# Patient Record
Sex: Female | Born: 1935 | Race: White | Hispanic: No | Smoking: Never smoker
Health system: Southern US, Community
[De-identification: ages and names within clinical notes are randomized; demographics above are authoritative.]

## PROBLEM LIST (undated history)

## (undated) DIAGNOSIS — L0390 Cellulitis, unspecified: Secondary | ICD-10-CM

## (undated) DIAGNOSIS — E7800 Pure hypercholesterolemia, unspecified: Secondary | ICD-10-CM

## (undated) DIAGNOSIS — I10 Essential (primary) hypertension: Secondary | ICD-10-CM

## (undated) DIAGNOSIS — K279 Peptic ulcer, site unspecified, unspecified as acute or chronic, without hemorrhage or perforation: Secondary | ICD-10-CM

## (undated) DIAGNOSIS — G459 Transient cerebral ischemic attack, unspecified: Secondary | ICD-10-CM

## (undated) DIAGNOSIS — N390 Urinary tract infection, site not specified: Secondary | ICD-10-CM

## (undated) DIAGNOSIS — R490 Dysphonia: Secondary | ICD-10-CM

## (undated) DIAGNOSIS — F0390 Unspecified dementia without behavioral disturbance: Secondary | ICD-10-CM

## (undated) DIAGNOSIS — L03113 Cellulitis of right upper limb: Secondary | ICD-10-CM

## (undated) DIAGNOSIS — R296 Repeated falls: Secondary | ICD-10-CM

## (undated) DIAGNOSIS — M539 Dorsopathy, unspecified: Secondary | ICD-10-CM

## (undated) HISTORY — DX: Cellulitis of right upper limb: L03.113

## (undated) HISTORY — PX: CHOLECYSTECTOMY: SHX55

## (undated) HISTORY — DX: Unspecified dementia, unspecified severity, without behavioral disturbance, psychotic disturbance, mood disturbance, and anxiety: F03.90

## (undated) HISTORY — DX: Dysphonia: R49.0

## (undated) HISTORY — DX: Pure hypercholesterolemia, unspecified: E78.00

## (undated) HISTORY — DX: Transient cerebral ischemic attack, unspecified: G45.9

## (undated) HISTORY — DX: Peptic ulcer, site unspecified, unspecified as acute or chronic, without hemorrhage or perforation: K27.9

## (undated) HISTORY — DX: Essential (primary) hypertension: I10

---

## 1999-12-08 ENCOUNTER — Ambulatory Visit (HOSPITAL_COMMUNITY): Admission: RE | Admit: 1999-12-08 | Discharge: 1999-12-08 | Payer: Self-pay | Admitting: Orthopedic Surgery

## 1999-12-08 ENCOUNTER — Encounter: Payer: Self-pay | Admitting: Orthopedic Surgery

## 2000-05-02 ENCOUNTER — Encounter: Payer: Self-pay | Admitting: Family Medicine

## 2000-05-02 ENCOUNTER — Encounter: Admission: RE | Admit: 2000-05-02 | Discharge: 2000-05-02 | Payer: Self-pay | Admitting: Family Medicine

## 2000-07-08 ENCOUNTER — Encounter: Payer: Self-pay | Admitting: Emergency Medicine

## 2000-07-08 ENCOUNTER — Emergency Department (HOSPITAL_COMMUNITY): Admission: EM | Admit: 2000-07-08 | Discharge: 2000-07-08 | Payer: Self-pay | Admitting: Emergency Medicine

## 2000-12-13 ENCOUNTER — Emergency Department (HOSPITAL_COMMUNITY): Admission: EM | Admit: 2000-12-13 | Discharge: 2000-12-14 | Payer: Self-pay | Admitting: Emergency Medicine

## 2000-12-13 ENCOUNTER — Encounter: Payer: Self-pay | Admitting: Emergency Medicine

## 2000-12-14 ENCOUNTER — Ambulatory Visit (HOSPITAL_COMMUNITY): Admission: EM | Admit: 2000-12-14 | Discharge: 2000-12-14 | Payer: Self-pay | Admitting: Emergency Medicine

## 2000-12-14 ENCOUNTER — Encounter: Payer: Self-pay | Admitting: Emergency Medicine

## 2001-05-18 ENCOUNTER — Encounter: Admission: RE | Admit: 2001-05-18 | Discharge: 2001-05-18 | Payer: Self-pay | Admitting: Family Medicine

## 2001-05-18 ENCOUNTER — Encounter: Payer: Self-pay | Admitting: Family Medicine

## 2002-05-14 ENCOUNTER — Encounter: Payer: Self-pay | Admitting: Family Medicine

## 2002-05-14 ENCOUNTER — Encounter: Admission: RE | Admit: 2002-05-14 | Discharge: 2002-05-14 | Payer: Self-pay | Admitting: Family Medicine

## 2003-05-19 ENCOUNTER — Encounter: Payer: Self-pay | Admitting: Family Medicine

## 2003-05-19 ENCOUNTER — Encounter: Admission: RE | Admit: 2003-05-19 | Discharge: 2003-05-19 | Payer: Self-pay | Admitting: Family Medicine

## 2004-05-25 ENCOUNTER — Encounter: Admission: RE | Admit: 2004-05-25 | Discharge: 2004-05-25 | Payer: Self-pay | Admitting: Family Medicine

## 2004-08-11 ENCOUNTER — Ambulatory Visit: Payer: Self-pay | Admitting: Family Medicine

## 2005-01-05 ENCOUNTER — Ambulatory Visit: Payer: Self-pay | Admitting: Family Medicine

## 2005-01-05 ENCOUNTER — Ambulatory Visit: Payer: Self-pay | Admitting: Internal Medicine

## 2005-01-20 ENCOUNTER — Ambulatory Visit: Payer: Self-pay | Admitting: Internal Medicine

## 2005-03-28 ENCOUNTER — Ambulatory Visit: Payer: Self-pay | Admitting: Family Medicine

## 2005-05-12 ENCOUNTER — Ambulatory Visit: Payer: Self-pay | Admitting: Family Medicine

## 2005-05-31 ENCOUNTER — Ambulatory Visit: Payer: Self-pay | Admitting: Family Medicine

## 2005-06-02 ENCOUNTER — Ambulatory Visit: Payer: Self-pay | Admitting: Family Medicine

## 2005-06-06 ENCOUNTER — Encounter: Admission: RE | Admit: 2005-06-06 | Discharge: 2005-06-06 | Payer: Self-pay | Admitting: Family Medicine

## 2005-06-24 ENCOUNTER — Encounter: Admission: RE | Admit: 2005-06-24 | Discharge: 2005-06-24 | Payer: Self-pay | Admitting: Neurology

## 2005-06-27 ENCOUNTER — Ambulatory Visit: Payer: Self-pay | Admitting: Family Medicine

## 2005-07-08 ENCOUNTER — Ambulatory Visit: Payer: Self-pay

## 2005-08-03 ENCOUNTER — Ambulatory Visit: Payer: Self-pay | Admitting: Family Medicine

## 2005-11-01 ENCOUNTER — Encounter: Admission: RE | Admit: 2005-11-01 | Discharge: 2005-11-01 | Payer: Self-pay | Admitting: Neurology

## 2005-11-02 ENCOUNTER — Ambulatory Visit: Payer: Self-pay | Admitting: Cardiology

## 2005-11-03 ENCOUNTER — Encounter: Payer: Self-pay | Admitting: Cardiology

## 2005-11-03 ENCOUNTER — Ambulatory Visit (HOSPITAL_COMMUNITY): Admission: RE | Admit: 2005-11-03 | Discharge: 2005-11-03 | Payer: Self-pay | Admitting: Cardiology

## 2005-11-03 ENCOUNTER — Ambulatory Visit: Payer: Self-pay | Admitting: Cardiology

## 2005-12-20 ENCOUNTER — Ambulatory Visit: Payer: Self-pay | Admitting: Family Medicine

## 2006-01-02 ENCOUNTER — Ambulatory Visit: Payer: Self-pay | Admitting: Family Medicine

## 2006-01-14 ENCOUNTER — Emergency Department (HOSPITAL_COMMUNITY): Admission: EM | Admit: 2006-01-14 | Discharge: 2006-01-14 | Payer: Self-pay | Admitting: Emergency Medicine

## 2006-01-17 ENCOUNTER — Ambulatory Visit: Payer: Self-pay | Admitting: Family Medicine

## 2006-06-07 ENCOUNTER — Encounter: Admission: RE | Admit: 2006-06-07 | Discharge: 2006-06-07 | Payer: Self-pay | Admitting: Family Medicine

## 2006-06-28 ENCOUNTER — Ambulatory Visit: Payer: Self-pay | Admitting: Family Medicine

## 2007-01-24 ENCOUNTER — Ambulatory Visit: Payer: Self-pay | Admitting: Family Medicine

## 2007-01-24 LAB — CONVERTED CEMR LAB
AST: 35 units/L (ref 0–37)
Albumin: 4.1 g/dL (ref 3.5–5.2)
Alkaline Phosphatase: 78 units/L (ref 39–117)
BUN: 29 mg/dL — ABNORMAL HIGH (ref 6–23)
Basophils Absolute: 0 10*3/uL (ref 0.0–0.1)
Chloride: 102 meq/L (ref 96–112)
Cholesterol: 251 mg/dL (ref 0–200)
Creatinine, Ser: 1.1 mg/dL (ref 0.4–1.2)
Direct LDL: 159.6 mg/dL
Eosinophils Absolute: 0.1 10*3/uL (ref 0.0–0.6)
GFR calc non Af Amer: 51 mL/min
HCT: 42.4 % (ref 36.0–46.0)
HDL: 42.6 mg/dL (ref >39.0)
MCHC: 33.7 g/dL (ref 30.0–36.0)
MCV: 95 fL (ref 78.0–100.0)
Monocytes Relative: 9 % (ref 3.0–11.0)
Neutrophils Relative %: 65.2 % (ref 43.0–77.0)
Platelets: 223 10*3/uL (ref 150–400)
Potassium: 3.3 meq/L — ABNORMAL LOW (ref 3.5–5.1)
RBC: 4.46 M/uL (ref 3.87–5.11)
RDW: 12.6 % (ref 11.5–14.6)
Sodium: 141 meq/L (ref 135–145)
TSH: 2.83 microintl units/mL (ref 0.35–5.50)
Total CHOL/HDL Ratio: 5.9
Triglycerides: 288 mg/dL (ref 0–149)

## 2007-06-05 DIAGNOSIS — M79609 Pain in unspecified limb: Secondary | ICD-10-CM

## 2007-06-07 ENCOUNTER — Ambulatory Visit: Payer: Self-pay | Admitting: Family Medicine

## 2007-06-07 DIAGNOSIS — M1990 Unspecified osteoarthritis, unspecified site: Secondary | ICD-10-CM | POA: Insufficient documentation

## 2007-06-11 ENCOUNTER — Telehealth: Payer: Self-pay | Admitting: Family Medicine

## 2007-06-14 ENCOUNTER — Encounter: Admission: RE | Admit: 2007-06-14 | Discharge: 2007-06-14 | Payer: Self-pay | Admitting: Family Medicine

## 2007-07-18 ENCOUNTER — Ambulatory Visit: Payer: Self-pay | Admitting: Family Medicine

## 2007-11-06 ENCOUNTER — Ambulatory Visit: Payer: Self-pay | Admitting: Family Medicine

## 2007-11-06 LAB — CONVERTED CEMR LAB
Glucose, Urine, Semiquant: NEGATIVE
Ketones, urine, test strip: NEGATIVE
pH: 5

## 2008-01-03 ENCOUNTER — Telehealth: Payer: Self-pay | Admitting: Family Medicine

## 2008-01-22 ENCOUNTER — Encounter: Admission: RE | Admit: 2008-01-22 | Discharge: 2008-01-22 | Payer: Self-pay

## 2008-03-18 ENCOUNTER — Encounter: Payer: Self-pay | Admitting: *Deleted

## 2008-06-18 ENCOUNTER — Encounter: Admission: RE | Admit: 2008-06-18 | Discharge: 2008-06-18 | Payer: Self-pay | Admitting: Family Medicine

## 2009-02-12 ENCOUNTER — Encounter: Admission: RE | Admit: 2009-02-12 | Discharge: 2009-02-12 | Payer: Self-pay | Admitting: Family Medicine

## 2009-03-09 ENCOUNTER — Encounter: Admission: RE | Admit: 2009-03-09 | Discharge: 2009-03-09 | Payer: Self-pay | Admitting: Family Medicine

## 2009-05-19 ENCOUNTER — Ambulatory Visit (HOSPITAL_COMMUNITY): Admission: RE | Admit: 2009-05-19 | Discharge: 2009-05-20 | Payer: Self-pay | Admitting: General Surgery

## 2009-05-19 ENCOUNTER — Encounter (INDEPENDENT_AMBULATORY_CARE_PROVIDER_SITE_OTHER): Payer: Self-pay | Admitting: General Surgery

## 2009-06-22 ENCOUNTER — Encounter: Admission: RE | Admit: 2009-06-22 | Discharge: 2009-06-22 | Payer: Self-pay | Admitting: Family Medicine

## 2009-08-10 ENCOUNTER — Encounter: Admission: RE | Admit: 2009-08-10 | Discharge: 2009-08-10 | Payer: Self-pay | Admitting: Family Medicine

## 2010-08-09 ENCOUNTER — Encounter: Admission: RE | Admit: 2010-08-09 | Discharge: 2010-08-09 | Payer: Self-pay | Admitting: Family Medicine

## 2011-01-01 LAB — HEPATIC FUNCTION PANEL
ALT: 27 U/L (ref 0–35)
AST: 29 U/L (ref 0–37)
Albumin: 4.1 g/dL (ref 3.5–5.2)
Alkaline Phosphatase: 65 U/L (ref 39–117)
Total Bilirubin: 0.7 mg/dL (ref 0.3–1.2)

## 2011-01-01 LAB — DIFFERENTIAL
Basophils Relative: 1 % (ref 0–1)
Lymphocytes Relative: 26 % (ref 12–46)
Lymphs Abs: 1.8 10*3/uL (ref 0.7–4.0)
Monocytes Relative: 9 % (ref 3–12)
Neutro Abs: 4.2 10*3/uL (ref 1.7–7.7)
Neutrophils Relative %: 63 % (ref 43–77)

## 2011-01-01 LAB — CBC
RBC: 4.18 MIL/uL (ref 3.87–5.11)
WBC: 6.8 10*3/uL (ref 4.0–10.5)

## 2011-01-01 LAB — BASIC METABOLIC PANEL
Calcium: 10.1 mg/dL (ref 8.4–10.5)
Creatinine, Ser: 0.99 mg/dL (ref 0.4–1.2)
GFR calc Af Amer: >60 mL/min (ref >60)
GFR calc non Af Amer: 54 mL/min — ABNORMAL LOW (ref >60)
Sodium: 139 mEq/L (ref 135–145)

## 2011-02-08 NOTE — Op Note (Signed)
Wanda Logan, Wanda Logan                ACCOUNT NO.:  [id number]   MEDICAL RECORD NO.:  [id number]          PATIENT TYPE:  OIB   LOCATION:  5123                         FACILITY:  [facility]   PHYSICIAN:  Ollen Gross. Vernell Morgans, M.D. DATE OF BIRTH:  [birth year]   DATE OF PROCEDURE:  05/19/2009  DATE OF DISCHARGE:                               OPERATIVE REPORT   PREOPERATIVE DIAGNOSIS:  Gallstones.   POSTOPERATIVE DIAGNOSES:  Cholecystitis, cholelithiasis.   PROCEDURE:  Laparoscopic cholecystectomy with intraoperative  cholangiogram.   SURGEON:  Ollen Gross. Vernell Morgans, MD   ASSISTANT:  Anselm Pancoast. Zachery Dakins, MD   ANESTHESIA:  General endotracheal.   PROCEDURE:  After informed consent was obtained, the patient was brought  to the operating room, placed in a supine position on operating room  table.  After adequate induction of general anesthesia, the patient's  abdomen was prepped with Betadine and draped in usual sterile manner.  The area below the umbilicus was infiltrated with 0.25% Marcaine.  A  small incision was made with 15-blade knife.  This incision was carried  down through the subcutaneous tissue bluntly with a hemostat and [employer] retractors until the linea alba was identified.  Linea alba was  incised with a 15-blade knife and each side was grasped with Kocher  clamps and elevated anteriorly.  The preperitoneal space was then probed  bluntly with a hemostat until the peritoneum was opened and access was  gained to the abdominal cavity.  A 0-Vicryl pursestring stitch was  placed in the fascia around the opening, Hasson cannula was placed  through the opening and anchored in place to the previously placed  Vicryl pursestring stitch.  The abdomen was then insufflated with carbon  dioxide without difficulty.  Laparoscope was inserted through the Hasson  cannula and the right upper quadrant was inspected, dome of gallbladder  and liver readily identified.  Next, the epigastric region was  infiltrated with 0.25% Marcaine.  A small incision was made with 15  blade knife and a 10-mm port was placed bluntly through this incision  into the abdominal cavity under direct vision.  Sites were then chosen  laterally on the right side with placement of 5-mm ports.  Each of these  areas were infiltrated with 0.25% Marcaine.  Small stab incisions were  made with a 15-blade knife and 5 mm ports placed bluntly through these  incisions into the abdominal cavity under direct vision.  The  gallbladder was tremendously thickened and difficult to grasp.  We tried  aspirating it, but it was just pack full of stones and aspirating really  did not help [place] grasp it very much.  We were able to elevate the dome of  the gallbladder anteriorly and superiorly.  Another blunt grasper was  placed through the other 5-mm port and used to retract on the body and  neck of the gallbladder.  Dissector was placed through the epigastric  port.  The peritoneal reflection at the gallbladder neck was opened.  Blunt dissection was carried out in this area.  This area was very  thickened and  inflamed and difficult to work with.  We were able to use  some hydrodissection and we were eventually able to identify the  gallbladder cystic duct junction and surround it bluntly and  circumferentially.  It was too thick to get a clip on, we therefore had  to make a small ductotomy at the gallbladder neck area.  We were able to  milk some stones out and evacuate them.  We then placed a 14-gauge  Angiocath through the anterior abdominal wall under direct vision.  A  Reddick cholangiogram catheter was placed through the Angiocath and  flushed.  The Reddick catheter was then placed within the cystic duct.  The balloon was inflated and we had a good seal.  We were able to get a  cholangiogram at that point, which showed no filling defects, a short  cystic duct, and good emptying in the duodenum.  We then removed the  Reddick  catheter.  We divided the cystic duct at that point and then  placed two #1 PDS Endoloops around the cystic duct stump.  This appeared  to give [place] good control of the cystic duct stump.  We were also able to  behind this identify the cystic artery and dissect it circumferentially  in a blunt manner until a good window was created.  Two clips were  placed proximally and distally and the artery was divided between the  two.  Next, we used a laparoscopic hook cautery device to separate the  gallbladder from liver bed prior to completely detaching gallbladder  from liver bed.  The liver bed was inspected and several small bleeding  points were coagulated with the electrocautery until the area was  completely hemostatic.  The gallbladder was then detached the rest of  the away from liver bed without difficulty with a laparoscopic hook.  Laparoscopic bag was inserted through the epigastric port.  The  gallbladder was placed in the bag with the bag was sealed.  The abdomen  was then irrigated with copious amounts of saline.  Laparoscope was  moved through the epigastric port.  Using a gallbladder grasper, we were  able to grasp the opening of the bag.  The bag with the gallbladder was  removed.  With the Hasson cannula through the infraumbilical port, we  did had widened fascial defect a little bit as well as evacuated some of  the stones before we could get the gallbladder out, but this happened  without any difficulty.  We then replaced the Hasson cannula, removed  the camera back to the Hasson.  A blunt grasper was placed through the  lateral-most 5-mm port and up through the epigastric port.  A 19-French  round Harrison Mons drain was then brought through these ports into the abdomen  out the lateral 5-mm port site.  The drain was placed in the gallbladder  bed.  The drain was anchored to the skin with 3-0 nylon stitch.  The  liver bed was inspected again and appeared to be hemostatic.  The camera   was then moved back to the epigastric port.  Hasson cannula was removed,  the fascial defect was closed with multiple figure-of-eight 0-Vicryl  stitches.  The rest of ports were then removed under direct vision and  gas was allowed to escape.  The skin incisions were closed with  interrupted 4-0 Monocryl subcuticular stitches.  Dermabond and sterile  dressings were applied.  The patient tolerated the procedure well.  At  the end of the case,  all needle, sponge, and instrument counts were  correct.  The patient was then awakened and taken to recovery room in  stable condition.      Ollen Gross. Vernell Morgans, M.D.  Electronically Signed     PST/MEDQ  D:  05/19/2009  T:  05/20/2009  Job:  [id number]

## 2011-11-21 ENCOUNTER — Other Ambulatory Visit: Payer: Self-pay | Admitting: Family Medicine

## 2011-11-22 ENCOUNTER — Other Ambulatory Visit: Payer: Self-pay | Admitting: Family Medicine

## 2011-11-22 DIAGNOSIS — N644 Mastodynia: Secondary | ICD-10-CM

## 2011-12-01 ENCOUNTER — Ambulatory Visit
Admission: RE | Admit: 2011-12-01 | Discharge: 2011-12-01 | Disposition: A | Discharge status: Home / Self Care | Payer: Medicare Other | Source: Ambulatory Visit | Attending: Family Medicine | Admitting: Family Medicine

## 2011-12-01 DIAGNOSIS — N644 Mastodynia: Secondary | ICD-10-CM

## 2012-03-08 ENCOUNTER — Encounter: Payer: Self-pay | Admitting: *Deleted

## 2012-11-05 ENCOUNTER — Other Ambulatory Visit: Payer: Self-pay | Admitting: Family Medicine

## 2012-11-05 DIAGNOSIS — Z1231 Encounter for screening mammogram for malignant neoplasm of breast: Secondary | ICD-10-CM

## 2012-11-17 ENCOUNTER — Emergency Department (HOSPITAL_BASED_OUTPATIENT_CLINIC_OR_DEPARTMENT_OTHER)
Admission: EM | Admit: 2012-11-17 | Discharge: 2012-11-17 | Disposition: A | Discharge status: Home / Self Care | Payer: Medicare Other | Attending: Emergency Medicine | Admitting: Emergency Medicine

## 2012-11-17 ENCOUNTER — Encounter (HOSPITAL_BASED_OUTPATIENT_CLINIC_OR_DEPARTMENT_OTHER): Payer: Self-pay | Admitting: Emergency Medicine

## 2012-11-17 ENCOUNTER — Emergency Department (HOSPITAL_BASED_OUTPATIENT_CLINIC_OR_DEPARTMENT_OTHER): Payer: Medicare Other

## 2012-11-17 DIAGNOSIS — Y939 Activity, unspecified: Secondary | ICD-10-CM | POA: Insufficient documentation

## 2012-11-17 DIAGNOSIS — M545 Low back pain: Secondary | ICD-10-CM

## 2012-11-17 DIAGNOSIS — Z87440 Personal history of urinary (tract) infections: Secondary | ICD-10-CM | POA: Insufficient documentation

## 2012-11-17 DIAGNOSIS — R296 Repeated falls: Secondary | ICD-10-CM | POA: Insufficient documentation

## 2012-11-17 DIAGNOSIS — W19XXXA Unspecified fall, initial encounter: Secondary | ICD-10-CM

## 2012-11-17 DIAGNOSIS — Y929 Unspecified place or not applicable: Secondary | ICD-10-CM | POA: Insufficient documentation

## 2012-11-17 DIAGNOSIS — Z8673 Personal history of transient ischemic attack (TIA), and cerebral infarction without residual deficits: Secondary | ICD-10-CM | POA: Insufficient documentation

## 2012-11-17 DIAGNOSIS — Z8711 Personal history of peptic ulcer disease: Secondary | ICD-10-CM | POA: Insufficient documentation

## 2012-11-17 DIAGNOSIS — IMO0002 Reserved for concepts with insufficient information to code with codable children: Secondary | ICD-10-CM | POA: Insufficient documentation

## 2012-11-17 DIAGNOSIS — I10 Essential (primary) hypertension: Secondary | ICD-10-CM | POA: Insufficient documentation

## 2012-11-17 DIAGNOSIS — Z79899 Other long term (current) drug therapy: Secondary | ICD-10-CM | POA: Insufficient documentation

## 2012-11-17 HISTORY — DX: Urinary tract infection, site not specified: N39.0

## 2012-11-17 HISTORY — DX: Dorsopathy, unspecified: M53.9

## 2012-11-17 LAB — URINALYSIS, ROUTINE W REFLEX MICROSCOPIC
Hgb urine dipstick: NEGATIVE
Protein, ur: NEGATIVE mg/dL
Urobilinogen, UA: 0.2 mg/dL (ref 0.0–1.0)

## 2012-11-17 MED ORDER — TRAMADOL HCL 50 MG PO TABS: 50.0000 mg | ORAL_TABLET | Freq: Four times a day (QID) | ORAL | Status: DC | PRN
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
## 2012-11-17 NOTE — ED Notes (Signed)
Pt states she fell approximately one week ago, but also fell again a few days ago.  Family states they noticed that the pt was c/o back pain, strong urine smell, and decreased appetite.

## 2012-12-04 ENCOUNTER — Ambulatory Visit: Payer: Medicare Other

## 2012-12-17 ENCOUNTER — Emergency Department (HOSPITAL_COMMUNITY): Payer: Medicare Other

## 2012-12-17 ENCOUNTER — Encounter (HOSPITAL_COMMUNITY): Payer: Self-pay | Admitting: Emergency Medicine

## 2012-12-17 DIAGNOSIS — F0390 Unspecified dementia without behavioral disturbance: Secondary | ICD-10-CM | POA: Insufficient documentation

## 2012-12-17 DIAGNOSIS — S2239XA Fracture of one rib, unspecified side, initial encounter for closed fracture: Secondary | ICD-10-CM | POA: Insufficient documentation

## 2012-12-17 DIAGNOSIS — W1809XA Striking against other object with subsequent fall, initial encounter: Secondary | ICD-10-CM | POA: Insufficient documentation

## 2012-12-17 DIAGNOSIS — T07XXXA Unspecified multiple injuries, initial encounter: Secondary | ICD-10-CM | POA: Insufficient documentation

## 2012-12-17 DIAGNOSIS — IMO0002 Reserved for concepts with insufficient information to code with codable children: Secondary | ICD-10-CM | POA: Insufficient documentation

## 2012-12-17 DIAGNOSIS — Z79899 Other long term (current) drug therapy: Secondary | ICD-10-CM | POA: Insufficient documentation

## 2012-12-17 DIAGNOSIS — Z87440 Personal history of urinary (tract) infections: Secondary | ICD-10-CM | POA: Insufficient documentation

## 2012-12-17 DIAGNOSIS — Z8673 Personal history of transient ischemic attack (TIA), and cerebral infarction without residual deficits: Secondary | ICD-10-CM | POA: Insufficient documentation

## 2012-12-17 DIAGNOSIS — I10 Essential (primary) hypertension: Secondary | ICD-10-CM | POA: Insufficient documentation

## 2012-12-17 DIAGNOSIS — Y92009 Unspecified place in unspecified non-institutional (private) residence as the place of occurrence of the external cause: Secondary | ICD-10-CM | POA: Insufficient documentation

## 2012-12-17 DIAGNOSIS — Y9389 Activity, other specified: Secondary | ICD-10-CM | POA: Insufficient documentation

## 2012-12-17 DIAGNOSIS — Z8711 Personal history of peptic ulcer disease: Secondary | ICD-10-CM | POA: Insufficient documentation

## 2012-12-17 LAB — CBC WITH DIFFERENTIAL/PLATELET
Basophils Absolute: 0 10*3/uL (ref 0.0–0.1)
Eosinophils Absolute: 0.1 10*3/uL (ref 0.0–0.7)
Eosinophils Relative: 2 % (ref 0–5)
Lymphs Abs: 0.9 10*3/uL (ref 0.7–4.0)
MCH: 32.5 pg (ref 26.0–34.0)
MCV: 92 fL (ref 78.0–100.0)
Monocytes Absolute: 0.7 10*3/uL (ref 0.1–1.0)
Platelets: 158 10*3/uL (ref 150–400)
RDW: 14.1 % (ref 11.5–15.5)

## 2012-12-17 LAB — COMPREHENSIVE METABOLIC PANEL
ALT: 14 U/L (ref 0–35)
Calcium: 10.2 mg/dL (ref 8.4–10.5)
Creatinine, Ser: 0.96 mg/dL (ref 0.50–1.10)
GFR calc Af Amer: 61 mL/min — ABNORMAL LOW (ref >90)
Glucose, Bld: 117 mg/dL — ABNORMAL HIGH (ref 70–99)
Sodium: 141 mEq/L (ref 135–145)
Total Protein: 6.9 g/dL (ref 6.0–8.3)

## 2012-12-17 NOTE — ED Notes (Addendum)
DAUGHTER REPORTS PT. FELL THIS MORNING AT HOME , PRESENTS WITH PAIN AT RIGHT LATERAL RIBCAGE , SMALL SKIN TEAR AT LEFT ELBOW AND LEFT UPPER LIP SWELLING , RESPIRATIONS UNLABORED , DAUGHTER ALSO CONCERNED ON PT'S PROGRESSING DEMENTIA. PT. CURRENTLY TAKING CIPRO ANTIBIOTIC FOR UTI.

## 2012-12-18 ENCOUNTER — Emergency Department (HOSPITAL_COMMUNITY)
Admission: EM | Admit: 2012-12-18 | Discharge: 2012-12-18 | Disposition: A | Discharge status: Home / Self Care | Payer: Medicare Other | Attending: Emergency Medicine | Admitting: Emergency Medicine

## 2012-12-18 ENCOUNTER — Emergency Department (HOSPITAL_COMMUNITY): Payer: Medicare Other

## 2012-12-18 DIAGNOSIS — T07XXXA Unspecified multiple injuries, initial encounter: Secondary | ICD-10-CM

## 2012-12-18 DIAGNOSIS — F0390 Unspecified dementia without behavioral disturbance: Secondary | ICD-10-CM

## 2012-12-18 DIAGNOSIS — S2231XA Fracture of one rib, right side, initial encounter for closed fracture: Secondary | ICD-10-CM

## 2012-12-18 NOTE — ED Provider Notes (Signed)
History     CSN: [id number]  Arrival date & time 12/17/12  2017   First MD Initiated Contact with Patient 12/18/12 0036      Chief Complaint  Patient presents with  . Fall    (Consider location/radiation/quality/duration/timing/severity/associated sxs/prior treatment) HPI Level 5 Caveat: dementia. This is an 79 year old female who has had a decline in mental function since about December. History is provided by her daughters. This is worsened over the past 3 weeks with confusion, difficulty expressing herself verbally, and wandering at night. She was wandering around yesterday morning at home and fell against a dresser. He has a contusion to her left upper lip. She has a contusion to her left breast. She has pain and tenderness to her right chest. She hasn't superficial is in tears of the left arm. She is awake and alert to her baseline mentally. She has not had any respiratory distress. She is being treated for urinary tract with Cipro; she has had multiple episodes of urinary tract infection in the past several months.  Past Medical History  Diagnosis Date  . Hypertension   . TIA (transient ischemic attack)   . Peptic ulcer disease   . UTI (lower urinary tract infection)   . Back problem     History reviewed. No pertinent past surgical history.  Family History  Problem Relation Age of Onset  . Stroke Father   . Stroke Mother   . Aneurysm Brother   . Coronary artery disease Sister   . Heart attack Sister   . Stroke Sister   . Stroke Sister     History  Substance Use Topics  . Smoking status: Never Smoker   . Smokeless tobacco: Never Used  . Alcohol Use: No    OB History   Grav Para Term Preterm Abortions TAB SAB Ect Mult Living                  Review of Systems  Unable to perform ROS   Allergies  Aspirin; Atorvastatin; and Penicillins  Home Medications   Current Outpatient Rx  Name  Route  Sig  Dispense  Refill  . acetaminophen (TYLENOL) 325 MG  tablet   Oral   Take 650 mg by mouth daily as needed for pain.         Marland Kitchen allopurinol (ZYLOPRIM) 100 MG tablet   Oral   Take 100 mg by mouth daily.         Marland Kitchen atenolol (TENORMIN) 50 MG tablet   Oral   Take 50 mg by mouth daily.         . Calcium Carbonate-Vitamin D (CALTRATE 600+D PO)   Oral   Take 1 tablet by mouth daily.         . celecoxib (CELEBREX) 200 MG capsule   Oral   Take 200 mg by mouth as needed.         . darifenacin (ENABLEX) 7.5 MG 24 hr tablet   Oral   Take 7.5 mg by mouth daily.         Marland Kitchen dipyridamole-aspirin (AGGRENOX) 25-200 MG per 12 hr capsule   Oral   Take 1 capsule by mouth 2 (two) times daily.         Marland Kitchen estradiol (ESTRACE) 0.1 MG/GM vaginal cream   Vaginal   Place 2 g vaginally 2 (two) times a week.         . fish oil-omega-3 fatty acids 1000 MG capsule   Oral  Take 1 g by mouth daily.         Marland Kitchen glucosamine-chondroitin 500-400 MG tablet   Oral   Take 1 tablet by mouth daily.         Marland Kitchen LORazepam (ATIVAN) 0.5 MG tablet   Oral   Take 0.5 mg by mouth every 8 (eight) hours as needed for anxiety.         . metroNIDAZOLE (METROCREAM) 0.75 % cream   Topical   Apply 1 application topically 2 (two) times daily.         . simvastatin (ZOCOR) 10 MG tablet   Oral   Take 10 mg by mouth at bedtime.         . traMADol (ULTRAM) 50 MG tablet   Oral   Take 50 mg by mouth every 6 (six) hours as needed for pain.           BP 125/57  Pulse 61  Temp(Src) 98.5 F (36.9 C) (Oral)  Resp 18  SpO2 94%  Physical Exam General: Well-developed, well-nourished female in no acute distress; appearance consistent with age of record HENT: normocephalic, swelling and ecchymosis of left upper lip Eyes: pupils equal round and reactive to light; extraocular muscles intact Neck: supple Heart: regular rate and rhythm Lungs: clear to auscultation bilaterally Chest: Ecchymosis and mild tenderness of the left breast; tenderness right lower  lateral rib cage without crepitus Abdomen: soft; nondistended; mild diffuse tenderness; bowel sounds present Extremities: No deformity; full range of motion; trace edema of lower legs; several superficial skin tears of left upper extremity Neurologic: Awake, alert, minimally verbal; motor function intact in all extremities and symmetric; no facial droop Skin: Warm and dry Psychiatric: Normal mood and affect    ED Course  Procedures (including critical care time)     MDM   Nursing notes and vitals signs, including pulse oximetry, reviewed.  Summary of this visit's results, reviewed by myself:  Labs:  Results for orders placed during the hospital encounter of 12/18/12 (from the past 24 hour(s))  CBC WITH DIFFERENTIAL     Status: None   Collection Time    12/17/12  8:37 PM      Result Value Range   WBC 7.5  4.0 - 10.5 K/uL   RBC 4.15  3.87 - 5.11 MIL/uL   Hemoglobin 13.5  12.0 - 15.0 g/dL   HCT 16.1  09.6 - 04.5 %   MCV 92.0  78.0 - 100.0 fL   MCH 32.5  26.0 - 34.0 pg   MCHC 35.3  30.0 - 36.0 g/dL   RDW 40.9  81.1 - 91.4 %   Platelets 158  150 - 400 K/uL   Neutrophils Relative 76  43 - 77 %   Neutro Abs 5.7  1.7 - 7.7 K/uL   Lymphocytes Relative 12  12 - 46 %   Lymphs Abs 0.9  0.7 - 4.0 K/uL   Monocytes Relative 10  3 - 12 %   Monocytes Absolute 0.7  0.1 - 1.0 K/uL   Eosinophils Relative 2  0 - 5 %   Eosinophils Absolute 0.1  0.0 - 0.7 K/uL   Basophils Relative 0  0 - 1 %   Basophils Absolute 0.0  0.0 - 0.1 K/uL  COMPREHENSIVE METABOLIC PANEL     Status: Abnormal   Collection Time    12/17/12  8:37 PM      Result Value Range   Sodium 141  135 - 145 mEq/L  Potassium 4.1  3.5 - 5.1 mEq/L   Chloride 103  96 - 112 mEq/L   CO2 27  19 - 32 mEq/L   Glucose, Bld 117 (*) 70 - 99 mg/dL   BUN 19  6 - 23 mg/dL   Creatinine, Ser 4.54  0.50 - 1.10 mg/dL   Calcium 09.8  8.4 - 11.9 mg/dL   Total Protein 6.9  6.0 - 8.3 g/dL   Albumin 4.0  3.5 - 5.2 g/dL   AST 19  0 - 37 U/L    ALT 14  0 - 35 U/L   Alkaline Phosphatase 85  39 - 117 U/L   Total Bilirubin 0.6  0.3 - 1.2 mg/dL   GFR calc non Af Amer 52 (*) >90 mL/min   GFR calc Af Amer 61 (*) >90 mL/min    Imaging Studies: Dg Ribs Unilateral W/chest Right  12/17/2012  *RADIOLOGY REPORT*  Clinical Data: Right rib pain secondary to a fall.  RIGHT RIBS AND CHEST - 3+ VIEW  Comparison: Chest x-ray dated 03/09/2009  Findings: There is a displaced fracture of the lateral aspect of the right tenth rib.  Tortuous thoracic aorta.  Pulmonary vascularity and overall heart size is normal.  Slight scarring at both lung bases.  No pneumothorax or lung contusion or pleural effusion.  Arthritic changes at both shoulders, right greater than left.  Moderate thoracolumbar scoliosis, unchanged.  IMPRESSION: Acute displaced fracture of the lateral aspect of the right tenth rib.   Original Report Authenticated By: Francene Boyers, M.D.    Ct Head Wo Contrast  12/18/2012  *RADIOLOGY REPORT*  Clinical Data: Larey Seat.  Hit head.  CT HEAD WITHOUT CONTRAST  Technique:  Contiguous axial images were obtained from the base of the skull through the vertex without contrast.  Comparison: MRI brain 11/01/2005.  Findings: There is age related cerebral atrophy, ventriculomegaly and periventricular white matter disease.  Remote cortical infarcts are suspected at the right vertex.  No CT findings for acute hemispheric infarction and/or intracranial hemorrhage.  No extra- axial fluid collections are identified.  The brainstem and cerebellum grossly normal.  No acute bony findings.  The paranasal sinuses mastoid air cells are clear.  Globes are intact.  IMPRESSION:  1.  No acute intracranial findings or acute skull fracture. 2.  Age related cerebral atrophy, ventriculomegaly and periventricular white matter disease. 3.  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   Original Report Authenticated By: Rudie Meyer, M.D.    EKG Interpretation:  Date & Time:  12/17/2012 8:46 PM  Rate: 70  Rhythm: normal sinus rhythm and premature ventricular contractions (PVC)  QRS Axis: normal  Intervals: normal  ST/T Wave abnormalities: normal  Conduction Disutrbances:none  Narrative Interpretation: Poor R-wave progression  Old EKG Reviewed: none available  2:30 AM Patient alert more talkative at this time. Patient's family advised of her lab and x-ray findings. She has Ultram at home she can take for treatment of her rib pain.          Hanley Seamen, MD 12/18/12 [phone]

## 2012-12-18 NOTE — ED Notes (Signed)
Pt or family denies any questions, pt denies any pain upon discharge.

## 2013-02-09 ENCOUNTER — Encounter: Payer: Self-pay | Admitting: Neurology

## 2013-02-09 DIAGNOSIS — N390 Urinary tract infection, site not specified: Secondary | ICD-10-CM | POA: Insufficient documentation

## 2013-02-09 DIAGNOSIS — M539 Dorsopathy, unspecified: Secondary | ICD-10-CM | POA: Insufficient documentation

## 2013-02-09 DIAGNOSIS — G459 Transient cerebral ischemic attack, unspecified: Secondary | ICD-10-CM

## 2013-02-09 DIAGNOSIS — R490 Dysphonia: Secondary | ICD-10-CM

## 2013-02-09 DIAGNOSIS — I10 Essential (primary) hypertension: Secondary | ICD-10-CM

## 2013-02-09 DIAGNOSIS — K279 Peptic ulcer, site unspecified, unspecified as acute or chronic, without hemorrhage or perforation: Secondary | ICD-10-CM | POA: Insufficient documentation

## 2013-02-09 DIAGNOSIS — F0390 Unspecified dementia without behavioral disturbance: Secondary | ICD-10-CM | POA: Insufficient documentation

## 2013-02-09 DIAGNOSIS — E7800 Pure hypercholesterolemia, unspecified: Secondary | ICD-10-CM

## 2013-02-11 ENCOUNTER — Ambulatory Visit: Payer: Medicare Other | Admitting: Neurology

## 2013-02-25 ENCOUNTER — Encounter (HOSPITAL_COMMUNITY): Payer: Self-pay | Admitting: *Deleted

## 2013-02-25 ENCOUNTER — Inpatient Hospital Stay (HOSPITAL_COMMUNITY)
Admission: EM | Admit: 2013-02-25 | Discharge: 2013-03-02 | DRG: 602 | Disposition: A | Discharge status: Skilled Nursing Facility | Payer: Medicare Other | Attending: Internal Medicine | Admitting: Internal Medicine

## 2013-02-25 ENCOUNTER — Emergency Department (HOSPITAL_COMMUNITY): Payer: Medicare Other

## 2013-02-25 DIAGNOSIS — N390 Urinary tract infection, site not specified: Secondary | ICD-10-CM

## 2013-02-25 DIAGNOSIS — Z9181 History of falling: Secondary | ICD-10-CM

## 2013-02-25 DIAGNOSIS — M79609 Pain in unspecified limb: Secondary | ICD-10-CM

## 2013-02-25 DIAGNOSIS — I10 Essential (primary) hypertension: Secondary | ICD-10-CM | POA: Diagnosis present

## 2013-02-25 DIAGNOSIS — M1990 Unspecified osteoarthritis, unspecified site: Secondary | ICD-10-CM

## 2013-02-25 DIAGNOSIS — Z880 Allergy status to penicillin: Secondary | ICD-10-CM

## 2013-02-25 DIAGNOSIS — D696 Thrombocytopenia, unspecified: Secondary | ICD-10-CM | POA: Diagnosis present

## 2013-02-25 DIAGNOSIS — R490 Dysphonia: Secondary | ICD-10-CM

## 2013-02-25 DIAGNOSIS — R296 Repeated falls: Secondary | ICD-10-CM

## 2013-02-25 DIAGNOSIS — Z7982 Long term (current) use of aspirin: Secondary | ICD-10-CM

## 2013-02-25 DIAGNOSIS — M539 Dorsopathy, unspecified: Secondary | ICD-10-CM

## 2013-02-25 DIAGNOSIS — Z8673 Personal history of transient ischemic attack (TIA), and cerebral infarction without residual deficits: Secondary | ICD-10-CM

## 2013-02-25 DIAGNOSIS — Z823 Family history of stroke: Secondary | ICD-10-CM

## 2013-02-25 DIAGNOSIS — G459 Transient cerebral ischemic attack, unspecified: Secondary | ICD-10-CM

## 2013-02-25 DIAGNOSIS — L03113 Cellulitis of right upper limb: Secondary | ICD-10-CM | POA: Diagnosis present

## 2013-02-25 DIAGNOSIS — Z888 Allergy status to other drugs, medicaments and biological substances status: Secondary | ICD-10-CM

## 2013-02-25 DIAGNOSIS — K279 Peptic ulcer, site unspecified, unspecified as acute or chronic, without hemorrhage or perforation: Secondary | ICD-10-CM

## 2013-02-25 DIAGNOSIS — IMO0001 Reserved for inherently not codable concepts without codable children: Secondary | ICD-10-CM

## 2013-02-25 DIAGNOSIS — IMO0002 Reserved for concepts with insufficient information to code with codable children: Principal | ICD-10-CM | POA: Diagnosis present

## 2013-02-25 DIAGNOSIS — M7020 Olecranon bursitis, unspecified elbow: Secondary | ICD-10-CM | POA: Diagnosis present

## 2013-02-25 DIAGNOSIS — Z9089 Acquired absence of other organs: Secondary | ICD-10-CM

## 2013-02-25 DIAGNOSIS — Z79899 Other long term (current) drug therapy: Secondary | ICD-10-CM

## 2013-02-25 DIAGNOSIS — L03119 Cellulitis of unspecified part of limb: Secondary | ICD-10-CM

## 2013-02-25 DIAGNOSIS — E860 Dehydration: Secondary | ICD-10-CM

## 2013-02-25 DIAGNOSIS — F0390 Unspecified dementia without behavioral disturbance: Secondary | ICD-10-CM

## 2013-02-25 DIAGNOSIS — M109 Gout, unspecified: Secondary | ICD-10-CM | POA: Diagnosis present

## 2013-02-25 DIAGNOSIS — Z7902 Long term (current) use of antithrombotics/antiplatelets: Secondary | ICD-10-CM

## 2013-02-25 DIAGNOSIS — Z886 Allergy status to analgesic agent status: Secondary | ICD-10-CM

## 2013-02-25 DIAGNOSIS — E43 Unspecified severe protein-calorie malnutrition: Secondary | ICD-10-CM | POA: Diagnosis present

## 2013-02-25 DIAGNOSIS — E785 Hyperlipidemia, unspecified: Secondary | ICD-10-CM | POA: Diagnosis present

## 2013-02-25 DIAGNOSIS — Z8249 Family history of ischemic heart disease and other diseases of the circulatory system: Secondary | ICD-10-CM

## 2013-02-25 DIAGNOSIS — Z602 Problems related to living alone: Secondary | ICD-10-CM

## 2013-02-25 DIAGNOSIS — E7800 Pure hypercholesterolemia, unspecified: Secondary | ICD-10-CM | POA: Diagnosis present

## 2013-02-25 DIAGNOSIS — L0390 Cellulitis, unspecified: Secondary | ICD-10-CM

## 2013-02-25 HISTORY — DX: Cellulitis of right upper limb: L03.113

## 2013-02-25 HISTORY — DX: Cellulitis, unspecified: L03.90

## 2013-02-25 HISTORY — DX: Repeated falls: R29.6

## 2013-02-25 LAB — CBC WITH DIFFERENTIAL/PLATELET
Basophils Relative: 0 % (ref 0–1)
HCT: 35.9 % — ABNORMAL LOW (ref 36.0–46.0)
Hemoglobin: 12.7 g/dL (ref 12.0–15.0)
Lymphocytes Relative: 3 % — ABNORMAL LOW (ref 12–46)
Lymphs Abs: 0.4 10*3/uL — ABNORMAL LOW (ref 0.7–4.0)
MCHC: 35.4 g/dL (ref 30.0–36.0)
Monocytes Absolute: 0.8 10*3/uL (ref 0.1–1.0)
Monocytes Relative: 6 % (ref 3–12)
Neutro Abs: 11.6 10*3/uL — ABNORMAL HIGH (ref 1.7–7.7)
RBC: 4 MIL/uL (ref 3.87–5.11)

## 2013-02-25 LAB — URINE MICROSCOPIC-ADD ON

## 2013-02-25 LAB — COMPREHENSIVE METABOLIC PANEL
Alkaline Phosphatase: 85 U/L (ref 39–117)
BUN: 27 mg/dL — ABNORMAL HIGH (ref 6–23)
CO2: 22 mEq/L (ref 19–32)
Chloride: 97 mEq/L (ref 96–112)
Creatinine, Ser: 0.9 mg/dL (ref 0.50–1.10)
GFR calc non Af Amer: 57 mL/min — ABNORMAL LOW (ref >90)
Glucose, Bld: 118 mg/dL — ABNORMAL HIGH (ref 70–99)
Potassium: 3.5 mEq/L (ref 3.5–5.1)
Total Bilirubin: 1.1 mg/dL (ref 0.3–1.2)

## 2013-02-25 LAB — URINALYSIS, ROUTINE W REFLEX MICROSCOPIC
Ketones, ur: 40 mg/dL — AB
Protein, ur: 100 mg/dL — AB
Urobilinogen, UA: 1 mg/dL (ref 0.0–1.0)

## 2013-02-25 MED ORDER — SODIUM CHLORIDE 0.9 % IV SOLN: INTRAVENOUS | Status: AC

## 2013-02-25 MED ORDER — SODIUM CHLORIDE 0.9 % IV SOLN: Freq: Once | INTRAVENOUS | Status: DC

## 2013-02-25 MED ORDER — CELECOXIB 200 MG PO CAPS
200.0000 mg | ORAL_CAPSULE | Freq: Every day | ORAL | Status: DC
  Administered 2013-02-25 – 2013-03-02 (×6): 200 mg via ORAL
  Filled 2013-02-25 (×6): qty 1

## 2013-02-25 MED ORDER — SODIUM CHLORIDE 0.9 % IV BOLUS (SEPSIS)
250.0000 mL | Freq: Once | INTRAVENOUS | Status: AC
  Administered 2013-02-25: 250 mL via INTRAVENOUS

## 2013-02-25 MED ORDER — VANCOMYCIN HCL IN DEXTROSE 750-5 MG/150ML-% IV SOLN
750.0000 mg | INTRAVENOUS | Status: DC
  Administered 2013-02-26 – 2013-02-27 (×2): 750 mg via INTRAVENOUS
  Filled 2013-02-25 (×2): qty 150

## 2013-02-25 MED ORDER — SODIUM CHLORIDE 0.9 % IV SOLN
Freq: Once | INTRAVENOUS | Status: AC
  Administered 2013-02-25: 09:00:00 via INTRAVENOUS

## 2013-02-25 MED ORDER — ACETAMINOPHEN 325 MG PO TABS
650.0000 mg | ORAL_TABLET | Freq: Four times a day (QID) | ORAL | Status: DC | PRN
  Administered 2013-02-28 – 2013-03-01 (×3): 650 mg via ORAL
  Filled 2013-02-25 (×4): qty 2

## 2013-02-25 MED ORDER — ATENOLOL 50 MG PO TABS
50.0000 mg | ORAL_TABLET | Freq: Every day | ORAL | Status: DC
  Administered 2013-02-25 – 2013-03-02 (×6): 50 mg via ORAL
  Filled 2013-02-25 (×6): qty 1

## 2013-02-25 MED ORDER — ASPIRIN-DIPYRIDAMOLE ER 25-200 MG PO CP12
1.0000 | ORAL_CAPSULE | Freq: Two times a day (BID) | ORAL | Status: DC
  Administered 2013-02-25 – 2013-03-02 (×11): 1 via ORAL
  Filled 2013-02-25 (×12): qty 1

## 2013-02-25 MED ORDER — ALBUTEROL SULFATE (5 MG/ML) 0.5% IN NEBU: 2.5000 mg | INHALATION_SOLUTION | RESPIRATORY_TRACT | Status: DC | PRN

## 2013-02-25 MED ORDER — ONDANSETRON HCL 4 MG/2ML IJ SOLN: 4.0000 mg | Freq: Four times a day (QID) | INTRAMUSCULAR | Status: DC | PRN

## 2013-02-25 MED ORDER — VANCOMYCIN HCL IN DEXTROSE 1-5 GM/200ML-% IV SOLN
1000.0000 mg | Freq: Once | INTRAVENOUS | Status: AC
  Administered 2013-02-25: 1000 mg via INTRAVENOUS
  Filled 2013-02-25: qty 200

## 2013-02-25 MED ORDER — TRAMADOL HCL 50 MG PO TABS
50.0000 mg | ORAL_TABLET | Freq: Four times a day (QID) | ORAL | Status: DC | PRN
  Administered 2013-02-25 – 2013-02-26 (×2): 50 mg via ORAL
  Filled 2013-02-25 (×2): qty 1

## 2013-02-25 MED ORDER — DONEPEZIL HCL 10 MG PO TABS
10.0000 mg | ORAL_TABLET | Freq: Every day | ORAL | Status: DC
  Administered 2013-02-25 – 2013-03-01 (×5): 10 mg via ORAL
  Filled 2013-02-25 (×6): qty 1

## 2013-02-25 MED ORDER — ONDANSETRON HCL 4 MG PO TABS: 4.0000 mg | ORAL_TABLET | Freq: Four times a day (QID) | ORAL | Status: DC | PRN

## 2013-02-25 NOTE — Progress Notes (Signed)
PT Cancellation Note  Patient Details Name: Wanda Logan MRN: [id number] DOB: 1924-11-29   Cancelled Treatment:  Pt has recently arrived in room and asks to wait til the am for PT eval.  Family aggreed      Donnetta Hail 02/25/2013, 2:49 PM
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
## 2013-02-25 NOTE — Progress Notes (Signed)
ANTIBIOTIC CONSULT NOTE - INITIAL  Pharmacy Consult for Vancomycin  Indication: upper extremity cellulitis  Allergies  Allergen Reactions  . Aspirin     REACTION: ULCER  . Atorvastatin   . Penicillins     Patient Measurements: Height: 5' 4.17" (163 cm) Weight: 137 lb 2 oz (62.2 kg) IBW/kg (Calculated) : 55.1   Vital Signs: Temp: 98.3 F (36.8 C) (06/02 1230) Temp src: Oral (06/02 1230) BP: 129/61 mmHg (06/02 1230) Pulse Rate: 70 (06/02 1230) Intake/Output from previous day:   Intake/Output from this shift:    Labs:  Recent Labs  02/25/13 0819  WBC 12.9*  HGB 12.7  PLT 153  CREATININE 0.90   Estimated Creatinine Clearance: 39.8 ml/min (by C-G formula based on Cr of 0.9). No results found for this basename: VANCOTROUGH, VANCOPEAK, VANCORANDOM, GENTTROUGH, GENTPEAK, GENTRANDOM, TOBRATROUGH, TOBRAPEAK, TOBRARND, AMIKACINPEAK, AMIKACINTROU, AMIKACIN,  in the last 72 hours   Microbiology: No results found for this or any previous visit (from the past 720 hour(s)).  Medical History: Past Medical History  Diagnosis Date  . Hypertension   . TIA (transient ischemic attack)   . Peptic ulcer disease   . UTI (lower urinary tract infection)   . Back problem   . High blood cholesterol   . Dysphonia   . Dementia   . Falls frequently     Medications:  Scheduled:  . atenolol  50 mg Oral Daily  . celecoxib  200 mg Oral Daily  . dipyridamole-aspirin  1 capsule Oral BID  . donepezil  10 mg Oral QHS   Assessment: 79 yr old female presented to ED with fever, pain, redness and swelling of right upper extremity.  Patient is on vancomycin for cellulitis, 1gm IV was given in ED at 11am today.     Goal of Therapy:  Vancomycin trough level 10-15 mcg/ml  Plan:  Start Vancomycin 750mg  IV q24hrs. F/u renal func, culture data, clinical status.  Wendie Simmer, PharmD, BCPS Clinical Pharmacist  Pager: [phone]     Marylouise Stacks 02/25/2013,1:26 PM

## 2013-02-25 NOTE — ED Provider Notes (Signed)
History     CSN: [id number]  Arrival date & time 02/25/13  4098   First MD Initiated Contact with Patient 02/25/13 0801      Chief Complaint  Patient presents with  . Fever    (Consider location/radiation/quality/duration/timing/severity/associated sxs/prior treatment) Patient is a 79 y.o. female presenting with fever. The history is provided by medical records (Pt's two daughters). No language interpreter was used.  Fever Max temp prior to arrival:  Pt's daughters say that pt has had frequent UTIs since December 2013.  She has had a severe decline in mental status since then, going from living independantly to needing 24 hour care.  She has fallen frequently, most recently yesterday and the day before. Temp source:  Subjective Severity:  Moderate Onset quality:  Sudden (Noted this AM by her daughters, who called EMS.  EMS gave her two Tylenols.) Timing:  Constant Progression:  Unchanged Chronicity:  New Relieved by:  Acetaminophen Worsened by:  Nothing tried Ineffective treatments:  None tried Associated symptoms comment:  Hx UTI, frequent falls.   Past Medical History  Diagnosis Date  . Hypertension   . TIA (transient ischemic attack)   . Peptic ulcer disease   . UTI (lower urinary tract infection)   . Back problem   . High blood cholesterol   . Dysphonia   . Dementia     No past surgical history on file.  Family History  Problem Relation Age of Onset  . Stroke Father   . Stroke Mother   . Aneurysm Brother   . Coronary artery disease Sister   . Heart attack Sister   . Stroke Sister   . Stroke Sister     History  Substance Use Topics  . Smoking status: Never Smoker   . Smokeless tobacco: Never Used  . Alcohol Use: No    OB History   Grav Para Term Preterm Abortions TAB SAB Ect Mult Living                  Review of Systems  Unable to perform ROS: Dementia  Constitutional: Positive for fever.    Allergies  Aspirin; Atorvastatin; and  Penicillins  Home Medications   Current Outpatient Rx  Name  Route  Sig  Dispense  Refill  . acetaminophen (TYLENOL) 325 MG tablet   Oral   Take 650 mg by mouth daily as needed for pain.         Marland Kitchen allopurinol (ZYLOPRIM) 100 MG tablet   Oral   Take 100 mg by mouth daily.         Marland Kitchen atenolol (TENORMIN) 50 MG tablet   Oral   Take 50 mg by mouth daily.         . Calcium Carbonate-Vitamin D (CALTRATE 600+D PO)   Oral   Take 1 tablet by mouth daily.         . celecoxib (CELEBREX) 200 MG capsule   Oral   Take 200 mg by mouth as needed.         . darifenacin (ENABLEX) 7.5 MG 24 hr tablet   Oral   Take 7.5 mg by mouth daily.         Marland Kitchen dipyridamole-aspirin (AGGRENOX) 25-200 MG per 12 hr capsule   Oral   Take 1 capsule by mouth 2 (two) times daily.         Marland Kitchen estradiol (ESTRACE) 0.1 MG/GM vaginal cream   Vaginal   Place 2 g vaginally 2 (two) times a  week.         . fish oil-omega-3 fatty acids 1000 MG capsule   Oral   Take 1 g by mouth daily.         Marland Kitchen glucosamine-chondroitin 500-400 MG tablet   Oral   Take 1 tablet by mouth daily.         Marland Kitchen LORazepam (ATIVAN) 0.5 MG tablet   Oral   Take 0.5 mg by mouth every 8 (eight) hours as needed for anxiety.         . metroNIDAZOLE (METROCREAM) 0.75 % cream   Topical   Apply 1 application topically 2 (two) times daily.         . simvastatin (ZOCOR) 10 MG tablet   Oral   Take 10 mg by mouth at bedtime.         . traMADol (ULTRAM) 50 MG tablet   Oral   Take 50 mg by mouth every 6 (six) hours as needed for pain.           BP 101/63  Pulse 82  Temp(Src) 98.5 F (36.9 C) (Oral)  Resp 23  SpO2 94%  Physical Exam  Nursing note and vitals reviewed. Constitutional: She appears well-developed and well-nourished. No distress.  T 98.5 in ED.  HENT:  Head: Normocephalic and atraumatic.  Right Ear: External ear normal.  Left Ear: External ear normal.  Mouth/Throat: Oropharynx is clear and moist.   Eyes: Conjunctivae and EOM are normal. Pupils are equal, round, and reactive to light. No scleral icterus.  Neck: Normal range of motion. Neck supple. No thyromegaly present.  Cardiovascular: Normal rate, regular rhythm and normal heart sounds.   Pulmonary/Chest: Breath sounds normal. She is in respiratory distress. She has no wheezes. She has no rales.  Abdominal: Soft. Bowel sounds are normal. She exhibits no distension. There is no tenderness.  Musculoskeletal:  She has redness and swelling of the right elbow and right forearm, with the appearance of cellulitis.  She has full ROM of the right elbow.  She has intact pulses, sensation and tendon function in the right hand.     Lymphadenopathy:    She has no cervical adenopathy.  Neurological:  Pt is awake, answers simple questions, but is unable to give her history or review of systems.  Skin: There is erythema.  Redness and swelling of skin of right elbow and right forearm.    Psychiatric: She has a normal mood and affect. Her behavior is normal.  Pleasantly demented.    ED Course  Procedures (including critical care time)  Labs Reviewed  CBC WITH DIFFERENTIAL  COMPREHENSIVE METABOLIC PANEL  URINALYSIS, ROUTINE W REFLEX MICROSCOPIC  URIC ACID   8:28 AM Pt was seen and had physical examination.  Lab workup was ordered.  Old charts were reviewed.  He two most recent visits have been for falls.  8:49 AM  Date: 02/25/2013  Rate: 82  Rhythm: normal sinus rhythm and premature ventricular contractions (PVC)  QRS Axis: normal  Intervals: normal QRS:  Poor R wave progression in procordial leads suggests possible old anterior myocardial infarction.  ST/T Wave abnormalities: nonspecific T wave changes  Conduction Disutrbances:none  Narrative Interpretation: Abnormal EKG  Old EKG Reviewed: unchanged  9:09 AM BP soft at 99/54.  Will give pt 250 ml normal saline.  10:35 AM Results for orders placed during the hospital encounter of  02/25/13  CBC WITH DIFFERENTIAL      Result Value Range   WBC 12.9 (*) 4.0 -  10.5 K/uL   RBC 4.00  3.87 - 5.11 MIL/uL   Hemoglobin 12.7  12.0 - 15.0 g/dL   HCT 16.1 (*) 09.6 - 04.5 %   MCV 89.8  78.0 - 100.0 fL   MCH 31.8  26.0 - 34.0 pg   MCHC 35.4  30.0 - 36.0 g/dL   RDW 40.9  81.1 - 91.4 %   Platelets 153  150 - 400 K/uL   Neutrophils Relative % 90 (*) 43 - 77 %   Neutro Abs 11.6 (*) 1.7 - 7.7 K/uL   Lymphocytes Relative 3 (*) 12 - 46 %   Lymphs Abs 0.4 (*) 0.7 - 4.0 K/uL   Monocytes Relative 6  3 - 12 %   Monocytes Absolute 0.8  0.1 - 1.0 K/uL   Eosinophils Relative 0  0 - 5 %   Eosinophils Absolute 0.0  0.0 - 0.7 K/uL   Basophils Relative 0  0 - 1 %   Basophils Absolute 0.0  0.0 - 0.1 K/uL  COMPREHENSIVE METABOLIC PANEL      Result Value Range   Sodium 135  135 - 145 mEq/L   Potassium 3.5  3.5 - 5.1 mEq/L   Chloride 97  96 - 112 mEq/L   CO2 22  19 - 32 mEq/L   Glucose, Bld 118 (*) 70 - 99 mg/dL   BUN 27 (*) 6 - 23 mg/dL   Creatinine, Ser 7.82  0.50 - 1.10 mg/dL   Calcium 9.4  8.4 - 95.6 mg/dL   Total Protein 6.8  6.0 - 8.3 g/dL   Albumin 3.4 (*) 3.5 - 5.2 g/dL   AST 21  0 - 37 U/L   ALT 10  0 - 35 U/L   Alkaline Phosphatase 85  39 - 117 U/L   Total Bilirubin 1.1  0.3 - 1.2 mg/dL   GFR calc non Af Amer 57 (*) >90 mL/min   GFR calc Af Amer 66 (*) >90 mL/min  URIC ACID      Result Value Range   Uric Acid, Serum 4.3  2.4 - 7.0 mg/dL   Dg Chest 2 View  10/27/3084   *RADIOLOGY REPORT*  Clinical Data: Fever, frequent falls  CHEST - 2 VIEW  Comparison: December 17, 2012.  Findings: Cardiomediastinal silhouette appears normal.  No acute pulmonary disease is noted.  Minimal scarring is seen throughout both lungs which is unchanged compared to prior exam.  No pleural effusion or pneumothorax is noted. However, there does appear to be a moderately displaced sternal fracture with some callus formation suggesting subacute fracture.  IMPRESSION: Moderately displaced sternal fracture  seen on lateral radiograph with callus formation suggesting subacute fracture.  No acute pulmonary disease is noted.   Original Report Authenticated By: Lupita Raider.,  M.D.   Dg Elbow Complete Right  02/25/2013   *RADIOLOGY REPORT*  Clinical Data: Fall.  Redness and swelling about the right elbow. History of gout.  RIGHT ELBOW - COMPLETE 3+ VIEW  Comparison: None.  Findings: No fracture, dislocation or joint effusion is identified. Mild enthesopathic change is seen at the common extensor origin. No foreign body or soft tissue gas collection is identified.  IMPRESSION: No acute finding.   Original Report Authenticated By: Holley Dexter, M.D.   10:36 AM Pt's WBC is high, suggests infection.  Her uric acid is normal.  Will plan to treat her cellulitis of the right arm with IV vancomycin.  11:09 AM Case discussed with Dr. Waymon Amato.  Admit to  med surg bed, inpatient status, to [facility] Team 8.    1. Cellulitis of elbow           Carleene Cooper III, MD 02/25/13 1120

## 2013-02-25 NOTE — ED Notes (Signed)
Pt in from home via [facility], pt c/o fever onset x 2 days, pt denies CP, N/V/D, pt c/o chills, pt c/o pain & burning sensation to R arm, per EMS report the pt fell several days ago without LOC & head injury, pt denies further injury to R arm, pt A&O x4, follows commands, speaks in complete sentences

## 2013-02-26 DIAGNOSIS — Z9181 History of falling: Secondary | ICD-10-CM

## 2013-02-26 DIAGNOSIS — M7989 Other specified soft tissue disorders: Secondary | ICD-10-CM

## 2013-02-26 DIAGNOSIS — E860 Dehydration: Secondary | ICD-10-CM

## 2013-02-26 DIAGNOSIS — IMO0002 Reserved for concepts with insufficient information to code with codable children: Principal | ICD-10-CM

## 2013-02-26 DIAGNOSIS — I10 Essential (primary) hypertension: Secondary | ICD-10-CM

## 2013-02-26 DIAGNOSIS — M79609 Pain in unspecified limb: Secondary | ICD-10-CM

## 2013-02-26 LAB — URINE CULTURE

## 2013-02-26 LAB — CBC
HCT: 34.4 % — ABNORMAL LOW (ref 36.0–46.0)
Hemoglobin: 12 g/dL (ref 12.0–15.0)
MCH: 31.4 pg (ref 26.0–34.0)
MCV: 90.1 fL (ref 78.0–100.0)
RBC: 3.82 MIL/uL — ABNORMAL LOW (ref 3.87–5.11)

## 2013-02-26 MED ORDER — ENSURE COMPLETE PO LIQD
237.0000 mL | Freq: Two times a day (BID) | ORAL | Status: DC
  Administered 2013-02-26 – 2013-03-02 (×9): 237 mL via ORAL

## 2013-02-26 MED ORDER — ADULT MULTIVITAMIN W/MINERALS CH
1.0000 | ORAL_TABLET | Freq: Every day | ORAL | Status: DC
  Administered 2013-02-26 – 2013-03-02 (×5): 1 via ORAL
  Filled 2013-02-26 (×6): qty 1

## 2013-02-26 MED ORDER — CIPROFLOXACIN HCL 750 MG PO TABS
750.0000 mg | ORAL_TABLET | Freq: Two times a day (BID) | ORAL | Status: DC
  Administered 2013-02-26 – 2013-02-28 (×4): 750 mg via ORAL
  Filled 2013-02-26 (×8): qty 1

## 2013-02-26 NOTE — Care Management Note (Signed)
   CARE MANAGEMENT NOTE 02/26/2013  Patient:  Wanda Logan, Wanda Logan   Account Number:  [id number]  Date Initiated:  02/26/2013  Documentation initiated by:  Kwesi Sangha  Subjective/Objective Assessment:   Notifed by pt RN, that family is interested in SNF placement.     Action/Plan:   Met with pt and son, pt two daughters both at work at this time. They are interested in SNF for this pt as they currently have 24 hr care in the home as sitters, no medical expertise. Also need rehab.   Anticipated DC Date:  02/28/2013   Anticipated DC Plan:  SKILLED NURSING FACILITY      DC Planning Services  CM consult      Choice offered to / List presented to:             Status of service:  Completed, signed off Medicare Important Message given?   (If response is "NO", the following Medicare IM given date fields will be blank) Date Medicare IM given:   Date Additional Medicare IM given:    Discharge Disposition:    Per UR Regulation:    If discussed at Long Length of Stay Meetings, dates discussed:    Comments:  02/26/2013 Spoke with pt and son re d/c needs, and explained role of CM and CSW. As family is interested in SNF for this pt and PT/OT has recommended, process for SNF search was explained to pt son. Son informed of CSW, Genelle Bal who will work with the family and pt to selected a SNF. CSW Genelle Bal was notified of family wishes and will meet with the pt and family. No HH needs at this time. Will continue to follow for progression. Johny Shock RN MPH, case [phone]

## 2013-02-26 NOTE — Evaluation (Addendum)
Occupational Therapy Evaluation Patient Details Name: Wanda Logan MRN: [id number] DOB: 1925-01-04 Today's Date: 02/26/2013 Time: 1478-2956 OT Time Calculation (min): 24 min  OT Assessment / Plan / Recommendation Clinical Impression  Pt admitted with R UE cellulits and falls. She displays decreased strength and functional mobility. Requires assist at baseline from 24/7 caregivers but usually +1 assist. Now requring +2 for safety to transfer to [facility] and back to chair today.     OT Assessment  Patient needs continued OT Services    Follow Up Recommendations  SNF;Supervision/Assistance - 24 hour    Barriers to Discharge      Equipment Recommendations  3 in 1 bedside comode    Recommendations for Other Services    Frequency  Min 2X/week    Precautions / Restrictions Precautions Precautions: Fall Restrictions Weight Bearing Restrictions: No        ADL  Eating/Feeding: Minimal assistance (pt is right handed. edema and cellulities R UE) Where Assessed - Eating/Feeding: Chair Grooming: Wash/dry face;Set up (pt used L hand due to edema in R UE; pt is right handed) Where Assessed - Grooming: Supported sitting Upper Body Bathing: Chest;Right arm;Left arm;Abdomen;Moderate assistance Where Assessed - Upper Body Bathing: Unsupported sitting Lower Body Bathing: +2 Total assistance Lower Body Bathing: Patient Percentage: 30% Where Assessed - Lower Body Bathing: Supported sit to stand Upper Body Dressing: Maximal assistance Where Assessed - Upper Body Dressing: Unsupported sitting Lower Body Dressing: +2 Total assistance Lower Body Dressing: Patient Percentage: 20% Where Assessed - Lower Body Dressing: Supported sit to stand Toilet Transfer: +2 Total assistance Toilet Transfer: Patient Percentage: 40% Toilet Transfer Method: Surveyor, minerals: Materials engineer and Hygiene: +2 Total assistance Toileting - Architect and  Hygiene: Patient Percentage: 40% Where Assessed - Engineer, mining and Hygiene: Sit to stand from 3-in-1 or toilet Equipment Used: Rolling walker ADL Comments: Son present and gave all of history. He reports pt has had a decline over the last 2 weeks and especially in the last week. Prior to 2 weeks ago she could help more with ADL but has been falling more in the last few days. She has 24/7 careigivers at home. He reports some agitation episodes while here. She was pleasant and cooperative for therapy session today. Educated  pt and son on elevation of R UE on pillows and exercises for R UE also.     OT Diagnosis: Generalized weakness  OT Problem List: Decreased strength;Decreased safety awareness;Impaired balance (sitting and/or standing);Decreased knowledge of use of DME or AE;Pain OT Treatment Interventions: Self-care/ADL training;DME and/or AE instruction;Patient/family education;Therapeutic activities;Therapeutic exercise   OT Goals Acute Rehab OT Goals OT Goal Formulation: With family Time For Goal Achievement: 03/12/13 Potential to Achieve Goals: Good ADL Goals Pt Will Perform Grooming: Unsupported;Sitting, chair;Sitting, edge of bed;with min assist (2 tasks incorporating R UE with 50% of task.) ADL Goal: Grooming - Progress: Goal set today Pt Will Transfer to Toilet: with mod assist;Stand pivot transfer;3-in-1 ADL Goal: Toilet Transfer - Progress: Goal set today Pt Will Perform Toileting - Clothing Manipulation: with mod assist;Standing ADL Goal: Toileting - Clothing Manipulation - Progress: Goal set today Arm Goals Additional Arm Goal #1: Pt/family will be independent with edema control strategies including elevation and R UE exercies. Arm Goal: Additional Goal #1 - Progress: Goal set today  Visit Information  Last OT Received On: 02/26/13 Assistance Needed: +2 PT/OT Co-Evaluation/Treatment: Yes    Subjective Data  Subjective: hello (pt greeted  therapists)  Patient Stated Goal: none stated. family agreeable to work on strengthening and getting up   Prior Functioning     Home Living Lives With: Other (Comment) (had 24* CG) Available Help at Discharge: Skilled Nursing Facility Home Adaptive Equipment: Walker - rolling Prior Function Level of Independence: Needs assistance Needs Assistance: Gait;Transfers;Bathing;Dressing;Feeding;Grooming;Toileting;Meal Prep;Light Housekeeping Bath: Maximal Dressing: Maximal Feeding: Supervision/set-up Grooming: Minimal Toileting: Moderate Meal Prep: Total Light Housekeeping: Total Driving: No Comments: pt used RW, required assist for ambulation 2* frequent falls, decline in mobility in last week Communication Communication: No difficulties Dominant Hand: Right         Vision/Perception     Cognition  Cognition Arousal/Alertness: Awake/alert Behavior During Therapy: WFL for tasks assessed/performed Overall Cognitive Status: Impaired/Different from baseline (pt has been somewhat agitated here)    Extremity/Trunk Assessment Right Upper Extremity Assessment RUE ROM/Strength/Tone: Deficits RUE ROM/Strength/Tone Deficits: R UE with redness and edema. Pt able to make light fist but decreased grip strength. Able to move wrist but not full range due to edema. She has some pain with elbow flexion/extension. AAROM of shoulder to flexion at 110 degrees then discomfort.  Left Upper Extremity Assessment LUE ROM/Strength/Tone: Deficits LUE ROM/Strength/Tone Deficits: limited shoulder flexion to AAROM of 120 then discomfort. Pt needed assist to start motion due to cognition. Elbow to distal at least 3+/5.  Right Lower Extremity Assessment RLE ROM/Strength/Tone: Within functional levels RLE Sensation: WFL - Light Touch RLE Coordination: WFL - gross/fine motor Left Lower Extremity Assessment LLE ROM/Strength/Tone: Within functional levels LLE Sensation: WFL - Light Touch LLE Coordination:  WFL - gross/fine motor Trunk Assessment Trunk Assessment: Kyphotic     Mobility Bed Mobility Bed Mobility: Not assessed Transfers Sit to Stand: 1: +2 Total assist Sit to Stand: Patient Percentage: 40% Stand to Sit: 1: +2 Total assist Stand to Sit: Patient Percentage: 40% Details for Transfer Assistance: VCs hand placement, assist to rise     Exercise     Balance Balance Balance Assessed: Yes Static Standing Balance Static Standing - Balance Support: Bilateral upper extremity supported Static Standing - Level of Assistance: 4: Min assist Static Standing - Comment/# of Minutes: 1 Dynamic Standing Balance Dynamic Standing - Level of Assistance: 1: +2 Total assist   End of Session OT - End of Session Equipment Utilized During Treatment: Gait belt Activity Tolerance: Patient tolerated treatment well Patient left: in chair;with call bell/phone within reach;with family/visitor present  GO     Lennox Laity [phone] 02/26/2013, 12:01 PM

## 2013-02-26 NOTE — Evaluation (Signed)
Physical Therapy Evaluation Patient Details Name: Wanda Logan MRN: [id number] DOB: 22-Sep-1925 Today's Date: 02/26/2013 Time: 4098-1191 PT Time Calculation (min): 24 min  PT Assessment / Plan / Recommendation Clinical Impression  79 y.o. female admitted with RUE cellulitis and falls. Pt requires +2 assist for transfers. SNF recommended. Pt would benefit from acute PT to maximize safety and independence with mobility.     PT Assessment  Patient needs continued PT services    Follow Up Recommendations  SNF    Does the patient have the potential to tolerate intense rehabilitation      Barriers to Discharge None      Equipment Recommendations  None recommended by PT    Recommendations for Other Services OT consult   Frequency Min 3X/week    Precautions / Restrictions Precautions Precautions: Fall Restrictions Weight Bearing Restrictions: No   Pertinent Vitals/Pain *0/10 at rest,  R hand "is sore" after mobility Elevated RUE**      Mobility  Bed Mobility Bed Mobility: Not assessed Transfers Transfers: Sit to Stand;Stand to Sit Sit to Stand: 1: +2 Total assist Sit to Stand: Patient Percentage: 40% Stand to Sit: 1: +2 Total assist Stand to Sit: Patient Percentage: 40% Details for Transfer Assistance: VCs hand placement, assist to rise Ambulation/Gait Ambulation/Gait Assistance: 1: +2 Total assist Ambulation/Gait: Patient Percentage: 40% Ambulation Distance (Feet): 4 Feet Assistive device: Rolling walker Ambulation/Gait Assistance Details: pivotal steps from recliner to 3 in 1, +2 for managing RW and for safety/balance, VCs to increase step length and for positioning in RW (steps too far behind it) Gait Pattern: Narrow base of support;Trunk flexed;Step-to pattern;Decreased step length - left;Decreased step length - right    Exercises     PT Diagnosis: Generalized weakness;Difficulty walking;Altered mental status  PT Problem List: Decreased activity  tolerance;Decreased mobility;Decreased balance;Decreased safety awareness PT Treatment Interventions:     PT Goals Acute Rehab PT Goals PT Goal Formulation: With patient/family Time For Goal Achievement: 02/26/13 Potential to Achieve Goals: Fair Pt will go Supine/Side to Sit: with min assist PT Goal: Supine/Side to Sit - Progress: Goal set today Pt will go Sit to Stand: with min assist PT Goal: Sit to Stand - Progress: Goal set today Pt will Transfer Bed to Chair/Chair to Bed: with min assist PT Transfer Goal: Bed to Chair/Chair to Bed - Progress: Goal set today Pt will Ambulate: 1 - 15 feet;with min assist;with rolling walker PT Goal: Ambulate - Progress: Goal set today  Visit Information  Last PT Received On: 02/26/13 Assistance Needed: +2 PT/OT Co-Evaluation/Treatment: Yes    Subjective Data  Subjective: My arm is sore.  Patient Stated Goal: none stated, pt has dementia   Prior Functioning  Home Living Lives With: Other (Comment) (had 24* CG) Available Help at Discharge: Skilled Nursing Facility Home Adaptive Equipment: Walker - rolling Prior Function Level of Independence: Needs assistance Needs Assistance: Gait;Transfers Driving: No Comments: pt used RW, required assist for ambulation 2* frequent falls, decline in mobility in last week Communication Communication: No difficulties    Cognition  Cognition Arousal/Alertness: Awake/alert Behavior During Therapy: WFL for tasks assessed/performed Overall Cognitive Status: History of cognitive impairments - at baseline    Extremity/Trunk Assessment Right Lower Extremity Assessment RLE ROM/Strength/Tone: Within functional levels RLE Sensation: WFL - Light Touch RLE Coordination: WFL - gross/fine motor Left Lower Extremity Assessment LLE ROM/Strength/Tone: Within functional levels LLE Sensation: WFL - Light Touch LLE Coordination: WFL - gross/fine motor Trunk Assessment Trunk Assessment: Kyphotic   Balance  Balance  Balance Assessed: Yes Static Standing Balance Static Standing - Balance Support: Bilateral upper extremity supported Static Standing - Level of Assistance: 4: Min assist Static Standing - Comment/# of Minutes: 1  End of Session PT - End of Session Equipment Utilized During Treatment: Gait belt Activity Tolerance: Patient tolerated treatment well Patient left: in chair;with call bell/phone within reach;with family/visitor present Nurse Communication: Mobility status  GP     Ralene Bathe Kistler 02/26/2013, 11:48 AM [phone]

## 2013-02-26 NOTE — Clinical Social Work Note (Signed)
CSW received consult regarding SNF placement for patient. CSW talked with patient's son and family is in agreement with SNF (full assessment to follow) and patient's information will be faxed out to skilled nursing facilities on 6/4.  Genelle Bal, MSW, LCSW [phone]

## 2013-02-26 NOTE — Progress Notes (Signed)
[facility] PROGRESS NOTE  Wanda Logan GNF:[id number] DOB: [birth year] DOA: 02/25/2013 PCP: Emeterio Reeve, MD  Brief narrative 79 y.o. female with PMH of moderate dementia, HTN, TIA, HL, and gout resides at home with 24/7 care givers, presented to the [facility] ED on 02/25/13 with complaints of fever, pain, redness and swelling of right upper extremity. Patient is unable to provide history secondary to dementia. History obtained from 2 daughters and a son at bedside. Patient has history of recurrent falls. She was found to have features suggestive of right upper extremity cellulitis & admitted for further evaluation.  Assessment/Plan: 1. Right upper extremity cellulitis: Elevate right upper extremity, continue IV vancomycin and monitor closely. Erythema of right upper extremity has decreased but has localized to area over the right elbow with associated tenderness, no real fluctuance and some painful flexion of elbow. Edema of right upper extremity persists. No significant warmth.? Right elbow bursitis. Orthopedics/Dr. Sherlean Foot consulted. We'll also get right upper extremity venous Doppler to rule out DVT-low index of suspicion. 2. Dehydration: Secondary to poor oral intake. Completed brief IV fluids. 3. Frequent falls: Likely secondary to generalized weakness and dementia. PT and OT evaluation. Family are contemplating SNF. 4. Hypertension: Controlled. Continue atenolol. 5. Hyperlipidemia: Statins discontinued by PCP. 6. TIA: Continue Aggrenox. 7. Dementia: Fairly advanced. Continue Aricept but if diarrhea persists, consider stopping. Patient apparently agitated overnight-took off clothes, trying to get out of bed. Currently family providing 24/7 supervision at bedside. Advised son, that is family cannot provide the same, hospital will try to arrange for a Recruitment consultant. Nursing to change patient to a camera room and bed alarm. Most light he secondary to sundowning. Patient missed  outpatient neurology consultation appointment-can be rescheduled after discharge. 8. History of gout: Uric acid normal 9. Subacute sternal fracture: Asymptomatic of chest pain. 10. Mild thrombocytopenia and leukocytosis: Follow CBC in a.m.   Code Status: Full  Family Communication: Son at bedside Disposition Plan: Possibly SNF- family to decide.   Consultants:  Orthopedics-Dr. Sherlean Foot  Procedures:  None  Antibiotics:  IV Vancomycin 6/2>   HPI/Subjective: Patient pleasantly confused. Complains of some pain in the right elbow. Overnight was confused and agitated per son.  Objective: Filed Vitals:   02/25/13 2049 02/26/13 0431 02/26/13 0909 02/26/13 1435  BP: 142/70 125/67 122/81   Pulse: 73 77 45 73  Temp: 98.1 F (36.7 C) 98.3 F (36.8 C) 98.4 F (36.9 C)   TempSrc: Oral Oral Axillary   Resp: 20 20    Height:      Weight: 62.2 kg (137 lb 2 oz)     SpO2: 93% 95% 99% 100%    Intake/Output Summary (Last 24 hours) at 02/26/13 1442 Last data filed at 02/26/13 1109  Gross per 24 hour  Intake    582 ml  Output    150 ml  Net    432 ml   Filed Weights   02/25/13 1230 02/25/13 1259 02/25/13 2049  Weight: 62.2 kg (137 lb 2 oz) 62.2 kg (137 lb 2 oz) 62.2 kg (137 lb 2 oz)    Exam:   General exam: Comfortable. Sitting on a reclining chair.  Respiratory system: Clear. No increased work of breathing.  Cardiovascular system: S1 & S2 heard, RRR. No JVD, murmurs, gallops, clicks or pedal edema.  Gastrointestinal system: Abdomen is nondistended, soft and nontender. Normal bowel sounds heard.  Central nervous system: Alert and oriented only to self. No focal neurological deficits.  Extremities: Symmetric 5 x  5 power. Right upper extremity nonpitting boggy edema below mid upper arm to right distal forearm. Erythema now localized to area over elbow tip with some softness but no real fluctuation. No increased in warmth. Peripheral pulses well felt.   Data Reviewed: Basic  Metabolic Panel:  Recent Labs Lab 02/25/13 0819  NA 135  K 3.5  CL 97  CO2 22  GLUCOSE 118*  BUN 27*  CREATININE 0.90  CALCIUM 9.4   Liver Function Tests:  Recent Labs Lab 02/25/13 0819  AST 21  ALT 10  ALKPHOS 85  BILITOT 1.1  PROT 6.8  ALBUMIN 3.4*   No results found for this basename: LIPASE, AMYLASE,  in the last 168 hours No results found for this basename: AMMONIA,  in the last 168 hours CBC:  Recent Labs Lab 02/25/13 0819 02/26/13 0645  WBC 12.9* 13.7*  NEUTROABS 11.6*  --   HGB 12.7 12.0  HCT 35.9* 34.4*  MCV 89.8 90.1  PLT 153 140*   Cardiac Enzymes: No results found for this basename: CKTOTAL, CKMB, CKMBINDEX, TROPONINI,  in the last 168 hours BNP (last 3 results) No results found for this basename: PROBNP,  in the last 8760 hours CBG: No results found for this basename: GLUCAP,  in the last 168 hours  Recent Results (from the past 240 hour(s))  URINE CULTURE     Status: None   Collection Time    02/25/13 10:23 AM      Result Value Range Status   Specimen Description URINE, CLEAN CATCH   Final   Special Requests NONE   Final   Culture  Setup Time 02/25/2013 11:34   Final   Colony Count 45,000 COLONIES/ML   Final   Culture     Final   Value: Multiple bacterial morphotypes present, none predominant. Suggest appropriate recollection if clinically indicated.   Report Status 02/26/2013 FINAL   Final     Studies: Dg Chest 2 View  02/25/2013   *RADIOLOGY REPORT*  Clinical Data: Fever, frequent falls  CHEST - 2 VIEW  Comparison: December 17, 2012.  Findings: Cardiomediastinal silhouette appears normal.  No acute pulmonary disease is noted.  Minimal scarring is seen throughout both lungs which is unchanged compared to prior exam.  No pleural effusion or pneumothorax is noted. However, there does appear to be a moderately displaced sternal fracture with some callus formation suggesting subacute fracture.  IMPRESSION: Moderately displaced sternal fracture  seen on lateral radiograph with callus formation suggesting subacute fracture.  No acute pulmonary disease is noted.   Original Report Authenticated By: Lupita Raider.,  M.D.   Dg Elbow Complete Right  02/25/2013   *RADIOLOGY REPORT*  Clinical Data: Fall.  Redness and swelling about the right elbow. History of gout.  RIGHT ELBOW - COMPLETE 3+ VIEW  Comparison: None.  Findings: No fracture, dislocation or joint effusion is identified. Mild enthesopathic change is seen at the common extensor origin. No foreign body or soft tissue gas collection is identified.  IMPRESSION: No acute finding.   Original Report Authenticated By: Holley Dexter, M.D.     Additional labs:   Scheduled Meds: . atenolol  50 mg Oral Daily  . celecoxib  200 mg Oral Daily  . dipyridamole-aspirin  1 capsule Oral BID  . donepezil  10 mg Oral QHS  . feeding supplement  237 mL Oral BID BM  . multivitamin with minerals  1 tablet Oral Daily  . vancomycin  750 mg Intravenous Q24H   Continuous  Infusions:   Principal Problem:   Cellulitis of right upper extremity Active Problems:   Hypertension   TIA (transient ischemic attack)   High blood cholesterol   Dementia   Falls frequently   Dehydration    Time spent: 25 minutes    [facility]  [facility] Pager [phone].   If 8PM-8AM, please contact night-coverage at [email or web address], password [facility] 02/26/2013, 2:42 PM  LOS: 1 day

## 2013-02-26 NOTE — Progress Notes (Signed)
INITIAL NUTRITION ASSESSMENT  DOCUMENTATION CODES Per approved criteria  -Severe malnutrition in the context of chronic illness   INTERVENTION: 1. Add Ensure Complete po BID, each supplement provides 350 kcal and 13 grams of protein. 2. Add MVI daily 3. RD to continue to follow nutrition care plan.  NUTRITION DIAGNOSIS: Inadequate oral intake related to recent overall medical decline as evidenced by ongoing weight loss and poor oral intake.   Goal: Intake to meet >90% of estimated nutrition needs.  Monitor:  weight trends, lab trends, I/O's, PO intake, supplement tolerance  Reason for Assessment: Malnutrition Screening Tool  79 y.o. female  Admitting Dx: Cellulitis of right upper extremity  ASSESSMENT: Pt with frequent UTIs since December 2013. Also with severe mental status decline, requiring 24-hour care.  Admitted with fever, pain and swelling of upper R extremity. Work-up reveals cellulitis.  14% wt loss x 4 months. Confirmed this with son at bedside. Son reports that pt's PCP has noticed this weight loss as well. Son reports that pt is just eating "bites of things" while at home and that her appetite has been poor for months now.  RD discussed ways to increase kcal and protein intake throughout the day. RD provided "High-Calorie High-Protein Nutrition Therapy" handout from the Academy of [employer]etics. Reviewed patient's dietary recall. Provided examples on ways to increase caloric density of foods and beverages frequently consumed by the patient. Also provided ideas to promote variety and to incorporate additional nutrient dense foods into patient's diet. Discussed eating small frequent meals and snacks to assist in increasing overall po intake.   Pt meets criteria for severe MALNUTRITION in the context of chronic illness as evidenced by <75% of estimated energy intake x at least 1 month and 14% wt loss x 4 months.  Height: Ht Readings from Last 1 Encounters:   02/25/13 5' 4.17" (1.63 m)    Weight: Wt Readings from Last 1 Encounters:  02/25/13 137 lb 2 oz (62.2 kg)    Ideal Body Weight: 120 lb  % Ideal Body Weight: 114%  Wt Readings from Last 10 Encounters:  02/25/13 137 lb 2 oz (62.2 kg)  11/17/12 160 lb (72.576 kg)  06/07/07 170 lb (77.111 kg)    Usual Body Weight: 160 lb  % Usual Body Weight: 86%  BMI:  Body mass index is 23.41 kg/(m^2). WNL  Estimated Nutritional Needs: Kcal: 1375 - 1500 Protein: 70 - 80 grams Fluid: at least 1.5 liters daily  Skin: skin tear R leg  Diet Order: Cardiac  EDUCATION NEEDS: -No education needs identified at this time   Intake/Output Summary (Last 24 hours) at 02/26/13 1024 Last data filed at 02/25/13 2059  Gross per 24 hour  Intake    345 ml  Output    150 ml  Net    195 ml    Last BM: 6/3  Labs:   Recent Labs Lab 02/25/13 0819  NA 135  K 3.5  CL 97  CO2 22  BUN 27*  CREATININE 0.90  CALCIUM 9.4  GLUCOSE 118*    CBG (last 3)  No results found for this basename: GLUCAP,  in the last 72 hours  Scheduled Meds: . atenolol  50 mg Oral Daily  . celecoxib  200 mg Oral Daily  . dipyridamole-aspirin  1 capsule Oral BID  . donepezil  10 mg Oral QHS  . vancomycin  750 mg Intravenous Q24H    Continuous Infusions:   Past Medical History  Diagnosis Date  .  Hypertension   . TIA (transient ischemic attack)   . Peptic ulcer disease   . UTI (lower urinary tract infection)   . Back problem   . High blood cholesterol   . Dysphonia   . Dementia   . Falls frequently   . Cellulitis 02/25/2013    right arm    Past Surgical History  Procedure Laterality Date  . Cholecystectomy      Jarold Motto MS, RD, LDN Pager: [phone] After-hours pager: [phone]

## 2013-02-26 NOTE — Consult Note (Signed)
NAME: Wanda Logan MRN:   [id number] DOB:   January 02, 1925     HISTORY AND PHYSICAL  CHIEF COMPLAINT:  Right swollen elbow  HISTORY:   Wanda Logan a 79 y.o. female 79 y.o. female with PMH of moderate dementia, HTN, TIA, HL, and gout resides at home with 24/7 care givers, presented to the [facility] ED on 02/25/13 with complaints of fever, pain, redness and swelling of right upper extremity. Patient is unable to provide history secondary to dementia. History obtained from 2 daughters and a son at bedside. Patient has history of recurrent falls. She was found to have features suggestive of right upper extremity cellulitis & admitted for further evaluation.  Ortho Consulted for surgical eval.   PAST MEDICAL HISTORY:   Past Medical History  Diagnosis Date  . Hypertension   . TIA (transient ischemic attack)   . Peptic ulcer disease   . UTI (lower urinary tract infection)   . Back problem   . High blood cholesterol   . Dysphonia   . Dementia   . Falls frequently   . Cellulitis 02/25/2013    right arm    PAST SURGICAL HISTORY:   Past Surgical History  Procedure Laterality Date  . Cholecystectomy      MEDICATIONS:   Medications Prior to Admission  Medication Sig Dispense Refill  . acetaminophen (TYLENOL) 325 MG tablet Take 650 mg by mouth daily as needed for pain.      Marland Kitchen atenolol (TENORMIN) 50 MG tablet Take 50 mg by mouth daily.      . celecoxib (CELEBREX) 200 MG capsule Take 200 mg by mouth daily.       Marland Kitchen dipyridamole-aspirin (AGGRENOX) 25-200 MG per 12 hr capsule Take 1 capsule by mouth 2 (two) times daily.      Marland Kitchen donepezil (ARICEPT) 10 MG tablet Take 10 mg by mouth at bedtime as needed.      . traMADol (ULTRAM) 50 MG tablet Take 50 mg by mouth every 6 (six) hours as needed for pain.        ALLERGIES:   Allergies  Allergen Reactions  . Aspirin     REACTION: ULCER  . Atorvastatin   . Penicillins     REVIEW OF SYSTEMS:   Negative except HPI  FAMILY HISTORY:    Family History  Problem Relation Age of Onset  . Stroke Father   . Stroke Mother   . Aneurysm Brother   . Coronary artery disease Sister   . Heart attack Sister   . Stroke Sister   . Stroke Sister     SOCIAL HISTORY:   reports that she has never smoked. She has never used smokeless tobacco. She reports that she does not drink alcohol or use illicit drugs.  PHYSICAL EXAM:  General appearance: alert, cooperative and no distress  General exam: Comfortable. Sitting on a reclining chair.  Respiratory system: Clear. No increased work of breathing.  Cardiovascular system: S1 & S2 heard, RRR. No JVD, murmurs, gallops, clicks or pedal edema.  Gastrointestinal system: Abdomen is nondistended, soft and nontender. Normal bowel sounds heard.  Central nervous system: Alert and oriented only to self. No focal neurological deficits.  Extremities: Symmetric 5 x 5 power. Right upper extremity nonpitting boggy edema below mid upper arm to right distal forearm. Erythema now localized to area over elbow tip with some softness but no real fluctuation. No increased in warmth. Peripheral pulses well felt.      LABORATORY STUDIES:  Recent Labs  02/25/13 0819 02/26/13 0645  WBC 12.9* 13.7*  HGB 12.7 12.0  HCT 35.9* 34.4*  PLT 153 140*     Recent Labs  02/25/13 0819  NA 135  K 3.5  CL 97  CO2 22  GLUCOSE 118*  BUN 27*  CREATININE 0.90  CALCIUM 9.4    STUDIES/RESULTS:  Dg Chest 2 View  02/25/2013   *RADIOLOGY REPORT*  Clinical Data: Fever, frequent falls  CHEST - 2 VIEW  Comparison: December 17, 2012.  Findings: Cardiomediastinal silhouette appears normal.  No acute pulmonary disease is noted.  Minimal scarring is seen throughout both lungs which is unchanged compared to prior exam.  No pleural effusion or pneumothorax is noted. However, there does appear to be a moderately displaced sternal fracture with some callus formation suggesting subacute fracture.  IMPRESSION: Moderately displaced  sternal fracture seen on lateral radiograph with callus formation suggesting subacute fracture.  No acute pulmonary disease is noted.   Original Report Authenticated By: Lupita Raider.,  M.D.   Dg Elbow Complete Right  02/25/2013   *RADIOLOGY REPORT*  Clinical Data: Fall.  Redness and swelling about the right elbow. History of gout.  RIGHT ELBOW - COMPLETE 3+ VIEW  Comparison: None.  Findings: No fracture, dislocation or joint effusion is identified. Mild enthesopathic change is seen at the common extensor origin. No foreign body or soft tissue gas collection is identified.  IMPRESSION: No acute finding.   Original Report Authenticated By: Holley Dexter, M.D.    ASSESSMENT:  Right elbow cellulitis        Principal Problem:   Cellulitis of right upper extremity Active Problems:   Hypertension   TIA (transient ischemic attack)   High blood cholesterol   Dementia   Falls frequently   Dehydration    PLAN: From ortho standpoint needs Cipro 750 BID for cellulitis.  That was ordered today.  Keep clean and dry and follow up 2 weeks with Dr. Tobin Chad office   [facility] 02/26/2013. 7:46 PM

## 2013-02-26 NOTE — Progress Notes (Signed)
VASCULAR LAB PRELIMINARY  PRELIMINARY  PRELIMINARY  PRELIMINARY  VASCULAR LAB PRELIMINARY  PRELIMINARY  PRELIMINARY  PRELIMINARY  Right upper extremity venous duplex completed.    Preliminary report:  Right :  No evidence of DVT or superficial thrombosis.    Amonda Brillhart, RVS 02/26/2013, 8:06 PM

## 2013-02-27 DIAGNOSIS — M79609 Pain in unspecified limb: Secondary | ICD-10-CM

## 2013-02-27 DIAGNOSIS — F0390 Unspecified dementia without behavioral disturbance: Secondary | ICD-10-CM

## 2013-02-27 DIAGNOSIS — E7800 Pure hypercholesterolemia, unspecified: Secondary | ICD-10-CM

## 2013-02-27 LAB — CBC
HCT: 31.6 % — ABNORMAL LOW (ref 36.0–46.0)
MCH: 31.3 pg (ref 26.0–34.0)
MCHC: 34.5 g/dL (ref 30.0–36.0)
MCV: 90.8 fL (ref 78.0–100.0)
RDW: 14.3 % (ref 11.5–15.5)

## 2013-02-27 MED ORDER — ENSURE COMPLETE PO LIQD: 237.0000 mL | Freq: Two times a day (BID) | ORAL | Status: DC

## 2013-02-27 MED ORDER — CIPROFLOXACIN HCL 750 MG PO TABS: 750.0000 mg | ORAL_TABLET | Freq: Two times a day (BID) | ORAL | Status: DC

## 2013-02-27 NOTE — Clinical Documentation Improvement (Signed)
MALNUTRITION DOCUMENTATION CLARIFICATION  THIS DOCUMENT IS NOT A PERMANENT PART OF THE MEDICAL RECORD  TO RESPOND TO THE THIS QUERY, FOLLOW THE INSTRUCTIONS BELOW:  1. If needed, update documentation for the patient's encounter via the notes activity.  2. Access this query again and click edit on the In [employer].  3. After updating, or not, click F2 to complete all highlighted (required) fields concerning your review. Select "additional documentation in the medical record" OR "no additional documentation provided".  4. Click Sign note button.  5. The deficiency will fall out of your In Basket *Please let us know if you are not able to complete this workflow by phone or e-mail (listed below).  Please update your documentation within the medical record to reflect your response to this query.                                                                                        02/27/13   Dear Dr. Clydia Llano / Associates,  In a better effort to capture your patient's severity of illness, reflect appropriate length of stay and utilization of resources, a review of the patient medical record has revealed the following indicators.    Based on your clinical judgment, please clarify and document in a progress note and/or discharge summary the clinical condition associated with the following supporting information:  In responding to this query please exercise your independent judgment.  The fact that a query is asked, does not imply that any particular answer is desired or expected.  Possible Clinical Conditions?   Severe Malnutrition    Severe Protein Calorie Malnutrition  Other Condition  Cannot clinically determine     Supporting Information:  Risk Factors:(As per Nutrition Consult) " Cellultits, HTN, TIA, PUD, Dementia   Signs & Symptoms:(As per Nutrition Consult) "Pt meets criteria for severe MALNUTRITION in the context of chronic illness as evidenced by <75% of estimated  energy intake x at least 1 month and 14% wt loss x 4 months."  Ht :5' 4.17"         Wt : 137 lb 2 oz  BMI:  23.41 kg/(m^2).   Diagnostics:(As per Nutrition Consult) 14% wt loss x 4 months. Confirmed this with son at bedside. Son reports that pt's PCP has noticed this weight loss as well. Son reports that pt is just eating "bites of things" while at home and that her appetite has been poor for months now.   Treatment:   INTERVENTION: 1. Add Ensure Complete po BID, each supplement provides 350 kcal and 13 grams of protein. 2. Add MVI daily 3. RD to continue to follow nutrition care plan.   Nutrition Consult done on 02-26-13 by Haynes Bast, RD   You may use possible, probable, or suspect with inpatient documentation. possible, probable, suspected diagnoses MUST be documented at the time of discharge  Reviewed: additional documentation in the medical record  Thank You,  Joanette Gula Delk RN, BSN, CCDS,  Clinical Documentation Specialist: [phone] Pager Health Information Management [place]

## 2013-02-27 NOTE — Clinical Social Work Psychosocial (Signed)
Clinical Social Work Department BRIEF PSYCHOSOCIAL ASSESSMENT 02/27/2013  Patient:  Wanda Logan, Wanda Logan     Account Number:  [id number]     Admit date:  02/25/2013  Clinical Social Worker:  Delmer Islam  Date/Time:  02/27/2013 05:51 AM  Referred by:  Physician  Date Referred:  02/26/2013 Referred for  SNF Placement   Other Referral:   Interview type:  Family Other interview type:    PSYCHOSOCIAL DATA Living Status:  ALONE Admitted from facility:   Level of care:   Primary support name:  Exa Bomba Primary support relationship to patient:  CHILD, ADULT Degree of support available:   All of patient's children are involved and concerned about patient. Children include Maisie Fus 'Deniece Portela' Bry ([phone]), Dorris Carnes, Janyth Contes, Tempie Hoist. They all live in [place]    CURRENT CONCERNS Current Concerns  Post-Acute Placement   Other Concerns:    SOCIAL WORK ASSESSMENT / PLAN On 02/26/13, CSW talked with patient's son Electa Sterry who was at the bedside regarding discharge plans and the recommendation of SNF for patient. Son reported that she and his siblings are all in agreement with SNF for ST rehab and possibly longer as patient cannot care for herself. Mr. Eickhoff explained that patient was home with 24 hour care from friends and others, but had fallen 2/3 times recently and they know that patient needs more care right now.    CSW explained SNF search process and gave son SNF list for [facility].   Assessment/plan status:  Psychosocial Support/Ongoing Assessment of Needs Other assessment/ plan:   Information/referral to community resources:   Son given [facility] list on 02/27/13    PATIENT'S/FAMILY'S RESPONSE TO PLAN OF CARE: Mr. Slee was very appreciative of CSW's assistance and advised that he can be the contact and will communicate with his siblings. Son is aware that patient is ready for discharge and just needs a facility.

## 2013-02-27 NOTE — Progress Notes (Signed)
Per MD, hold pt d/c until seen by ortho MD. Transport cancelled. [facility] notified that pt will not be transferring tonight. Will continue to monitor.

## 2013-02-27 NOTE — Progress Notes (Signed)
Pt with increased swelling to RUE just distal to elbow. Area is boggy/edematous. Initially this am presenting as 2 individual knots, now one large knot. MD paged, will continue to monitor.

## 2013-02-27 NOTE — Discharge Summary (Signed)
Physician Discharge Summary  Wanda Logan WUJ:[id number] DOB: 11-17-1924 DOA: 02/25/2013  PCP: Emeterio Reeve, MD  Admit date: 02/25/2013 Discharge date: 02/27/2013  Time spent: > 35 minutes  Recommendations for Outpatient Follow-up:  1. Patient will need follow up with Dr. Sherlean Foot 2. Also please monitor clinical progress and decide whether or not patient will need further antibiotics for right arm cellulitis  Discharge Diagnoses:  Principal Problem:   Cellulitis of right upper extremity Active Problems:   Hypertension   TIA (transient ischemic attack)   High blood cholesterol   Dementia   Falls frequently   Dehydration   Discharge Condition: stable  Diet recommendation: Low sodium heart healthy and ensure BID between meals.  Filed Weights   02/25/13 1259 02/25/13 2049 02/26/13 2027  Weight: 62.2 kg (137 lb 2 oz) 62.2 kg (137 lb 2 oz) 64.4 kg (141 lb 15.6 oz)    History of present illness:  79 y/o with history of moderate dementia, HTN, TIA, and gout that presented to the ED complaining of fever, pain, and swelling at right upper extremity  Hospital Course:   Right upper extremity cellulitis: Patient seen and evaluated by Ortho who recommended the following: From ortho standpoint needs Cipro 750 BID for cellulitis. That was ordered today. Keep clean and dry and follow up 2 weeks with Dr. Tobin Chad office 1. Dehydration: Resolved after IVF's 2. Frequent falls: Likely secondary to generalized weakness and dementia. PT and OT evaluation. Family are contemplating SNF. 3. Hypertension: Controlled. Continue atenolol. 4. Hyperlipidemia: Statins discontinued by PCP. 5. TIA: Continue Aggrenox. 6. Dementia: stable continue home regimen. 7. History of gout: Uric acid normal 8. Subacute sternal fracture: Asymptomatic of chest pain. 9. Mild thrombocytopenia and leukocytosis: Follow CBC in a.m.  Procedures:  none  Consultations:  Ortho: evaluated by Altamese Cabal  Discharge  Exam: Filed Vitals:   02/26/13 1917 02/26/13 2027 02/27/13 0429 02/27/13 0935  BP:  135/63 119/62 136/92  Pulse:  70 57 70  Temp: 98.6 F (37 C) 98.2 F (36.8 C) 98 F (36.7 C) 98.6 F (37 C)  TempSrc: Oral Oral Oral Oral  Resp:  22 20 19   Height:  5\' 4"  (1.626 m)    Weight:  64.4 kg (141 lb 15.6 oz)    SpO2:  97% 95% 99%    General: Pt in NAD, Alert and awake Cardiovascular: RRR, no mrg Respiratory: CTA BL, no wheezes  Discharge Instructions  Discharge Orders   Future Orders Complete By Expires     Call MD for:  severe uncontrolled pain  As directed     Call MD for:  temperature >100.4  As directed     Diet - low sodium heart healthy  As directed     Discharge instructions  As directed     Comments:      Please be sure to follow up with your pcp at the SNF.  Also you will need to follow up with in 2 weeks with Dr. Sherlean Foot.  Cipro should be taken for the next 10 days    Increase activity slowly  As directed         Medication List    TAKE these medications       acetaminophen 325 MG tablet  Commonly known as:  TYLENOL  Take 650 mg by mouth daily as needed for pain.     atenolol 50 MG tablet  Commonly known as:  TENORMIN  Take 50 mg by mouth daily.  celecoxib 200 MG capsule  Commonly known as:  CELEBREX  Take 200 mg by mouth daily.     ciprofloxacin 750 MG tablet  Commonly known as:  CIPRO  Take 1 tablet (750 mg total) by mouth 2 (two) times daily.     dipyridamole-aspirin 200-25 MG per 12 hr capsule  Commonly known as:  AGGRENOX  Take 1 capsule by mouth 2 (two) times daily.     donepezil 10 MG tablet  Commonly known as:  ARICEPT  Take 10 mg by mouth at bedtime as needed.     feeding supplement Liqd  Take 237 mLs by mouth 2 (two) times daily between meals.     traMADol 50 MG tablet  Commonly known as:  ULTRAM  Take 50 mg by mouth every 6 (six) hours as needed for pain.       Allergies  Allergen Reactions  . Aspirin     REACTION: ULCER  .  Atorvastatin   . Penicillins        Follow-up Information   Follow up with Raymon Mutton, MD. Call on 03/11/2013.   Contact information:   [address] [place] [address] [phone]        The results of significant diagnostics from this hospitalization (including imaging, microbiology, ancillary and laboratory) are listed below for reference.    Significant Diagnostic Studies: Dg Chest 2 View  02/25/2013   *RADIOLOGY REPORT*  Clinical Data: Fever, frequent falls  CHEST - 2 VIEW  Comparison: December 17, 2012.  Findings: Cardiomediastinal silhouette appears normal.  No acute pulmonary disease is noted.  Minimal scarring is seen throughout both lungs which is unchanged compared to prior exam.  No pleural effusion or pneumothorax is noted. However, there does appear to be a moderately displaced sternal fracture with some callus formation suggesting subacute fracture.  IMPRESSION: Moderately displaced sternal fracture seen on lateral radiograph with callus formation suggesting subacute fracture.  No acute pulmonary disease is noted.   Original Report Authenticated By: Lupita Raider.,  M.D.   Dg Elbow Complete Right  02/25/2013   *RADIOLOGY REPORT*  Clinical Data: Fall.  Redness and swelling about the right elbow. History of gout.  RIGHT ELBOW - COMPLETE 3+ VIEW  Comparison: None.  Findings: No fracture, dislocation or joint effusion is identified. Mild enthesopathic change is seen at the common extensor origin. No foreign body or soft tissue gas collection is identified.  IMPRESSION: No acute finding.   Original Report Authenticated By: Holley Dexter, M.D.    Microbiology: Recent Results (from the past 240 hour(s))  URINE CULTURE     Status: None   Collection Time    02/25/13 10:23 AM      Result Value Range Status   Specimen Description URINE, CLEAN CATCH   Final   Special Requests NONE   Final   Culture  Setup Time 02/25/2013 11:34   Final   Colony Count 45,000  COLONIES/ML   Final   Culture     Final   Value: Multiple bacterial morphotypes present, none predominant. Suggest appropriate recollection if clinically indicated.   Report Status 02/26/2013 FINAL   Final     Labs: Basic Metabolic Panel:  Recent Labs Lab 02/25/13 0819  NA 135  K 3.5  CL 97  CO2 22  GLUCOSE 118*  BUN 27*  CREATININE 0.90  CALCIUM 9.4   Liver Function Tests:  Recent Labs Lab 02/25/13 0819  AST 21  ALT 10  ALKPHOS 85  BILITOT  1.1  PROT 6.8  ALBUMIN 3.4*   No results found for this basename: LIPASE, AMYLASE,  in the last 168 hours No results found for this basename: AMMONIA,  in the last 168 hours CBC:  Recent Labs Lab 02/25/13 0819 02/26/13 0645 02/27/13 0630  WBC 12.9* 13.7* 11.7*  NEUTROABS 11.6*  --   --   HGB 12.7 12.0 10.9*  HCT 35.9* 34.4* 31.6*  MCV 89.8 90.1 90.8  PLT 153 140* 154   Cardiac Enzymes: No results found for this basename: CKTOTAL, CKMB, CKMBINDEX, TROPONINI,  in the last 168 hours BNP: BNP (last 3 results) No results found for this basename: PROBNP,  in the last 8760 hours CBG: No results found for this basename: GLUCAP,  in the last 168 hours     Signed:  Penny Pia  [facility] 02/27/2013, 1:47 PM

## 2013-02-27 NOTE — Clinical Social Work Placement (Addendum)
Clinical Social Work Department CLINICAL SOCIAL WORK PLACEMENT NOTE 02/27/2013  Patient:  Wanda Logan, Wanda Logan  Account Number:  [id number] Admit date:  02/25/2013  Clinical Social Worker:  Genelle Bal, LCSW  Date/time:  02/27/2013 05:59 AM  Clinical Social Work is seeking post-discharge placement for this patient at the following level of care:   SKILLED NURSING   (*CSW will update this form in Epic as items are completed)   02/26/2013  Patient/family provided with [facility] Department of Clinical Social Work's list of facilities offering this level of care within the geographic area requested by the patient (or if unable, by the patient's family).  02/26/2013  Patient/family informed of their freedom to choose among providers that offer the needed level of care, that participate in Medicare, Medicaid or managed care program needed by the patient, have an available bed and are willing to accept the patient.    Patient/family informed of [facility]' ownership interest in [facility], as well as of the fact that they are under no obligation to receive care at this facility.  PASARR submitted to EDS on 02/27/2013 PASARR number received from [employer] on 02/27/2013 - [id number]    FL2 transmitted to all facilities in geographic area requested by pt/family on  02/27/2013 FL2 transmitted to all facilities within larger geographic area on   Patient informed that his/her managed care company has contracts with or will negotiate with  certain facilities, including the following:     Patient/family informed of bed offers received:  02/27/2013 Patient [facility] at [facility] Physician recommends and patient chooses bed at    Patient to be transferred to [facility] on   Patient to be transferred to facility by ambulance  The following physician request were entered in Epic:   Additional Comments: 02/27/13: Clinicals sent to [facility] for pre-authorization of SNF stay and ambulance authorization also requested. Ambulance Authorization - [id number]. 02/27/13: CSW talked with patient's daughter Wanda Logan at the bedside and gave bed offers. Mrs. Bolen advised that she will contact siblings with bed offers and get back to CSW with decision.

## 2013-02-27 NOTE — Progress Notes (Signed)
Dr Cena Benton requesting notification of Orthopedic Surgeon regarding RLE swelling.

## 2013-02-27 NOTE — Progress Notes (Signed)
Report called to [facility] at [phone].

## 2013-02-27 NOTE — Progress Notes (Signed)
Per Ortho MD, arm will need to be monitored, states will not be able to see pt tonight, can come up in AM or pt can follow up as outpatient. Dr Cena Benton notified. Will continue to monitor.

## 2013-02-28 MED ORDER — VANCOMYCIN HCL IN DEXTROSE 1-5 GM/200ML-% IV SOLN
1000.0000 mg | INTRAVENOUS | Status: DC
  Administered 2013-02-28 – 2013-03-01 (×2): 1000 mg via INTRAVENOUS
  Filled 2013-02-28 (×4): qty 200

## 2013-02-28 NOTE — Progress Notes (Signed)
ANTIBIOTIC CONSULT NOTE - INITIAL  Pharmacy Consult for Vancomycin - asked to resume 6/5 Indication: upper extremity abscess  Allergies  Allergen Reactions  . Aspirin     REACTION: ULCER  . Atorvastatin   . Penicillins     Patient Measurements: Height: 5\' 4"  (162.6 cm) Weight: 141 lb 15.6 oz (64.399 kg) IBW/kg (Calculated) : 54.7   Vital Signs: Temp: 98.4 F (36.9 C) (06/05 0426) Temp src: Oral (06/05 0426) BP: 126/60 mmHg (06/05 0426) Pulse Rate: 64 (06/05 0426) Intake/Output from previous day: 06/04 0701 - 06/05 0700 In: -  Out: 400 [Urine:400] Intake/Output from this shift: Total I/O In: -  Out: 550 [Urine:550]  Labs:  Recent Labs  02/26/13 0645 02/27/13 0630  WBC 13.7* 11.7*  HGB 12.0 10.9*  PLT 140* 154   Estimated Creatinine Clearance: 39.5 ml/min (by C-G formula based on Cr of 0.9). No results found for this basename: VANCOTROUGH, Leodis Binet, VANCORANDOM, GENTTROUGH, GENTPEAK, GENTRANDOM, TOBRATROUGH, TOBRAPEAK, TOBRARND, AMIKACINPEAK, AMIKACINTROU, AMIKACIN,  in the last 72 hours   Microbiology: Recent Results (from the past 720 hour(s))  URINE CULTURE     Status: None   Collection Time    02/25/13 10:23 AM      Result Value Range Status   Specimen Description URINE, CLEAN CATCH   Final   Special Requests NONE   Final   Culture  Setup Time 02/25/2013 11:34   Final   Colony Count 45,000 COLONIES/ML   Final   Culture     Final   Value: Multiple bacterial morphotypes present, none predominant. Suggest appropriate recollection if clinically indicated.   Report Status 02/26/2013 FINAL   Final    Medical History: Past Medical History  Diagnosis Date  . Hypertension   . TIA (transient ischemic attack)   . Peptic ulcer disease   . UTI (lower urinary tract infection)   . Back problem   . High blood cholesterol   . Dysphonia   . Dementia   . Falls frequently   . Cellulitis 02/25/2013    right arm    Medications:  Scheduled:  . atenolol  50  mg Oral Daily  . celecoxib  200 mg Oral Daily  . ciprofloxacin  750 mg Oral BID  . dipyridamole-aspirin  1 capsule Oral BID  . donepezil  10 mg Oral QHS  . feeding supplement  237 mL Oral BID BM  . multivitamin with minerals  1 tablet Oral Daily  . vancomycin  1,000 mg Intravenous Q24H   Assessment: 79 yr old female presented to ED with fever, pain, redness and swelling of right upper extremity. Draining frank pus, and pharmacy asked to resume vancomycin.  Estimated CrCl ~ 40 ml/min.  No cultures.  Goal of Therapy:  Vancomycin trough 15-20  Plan:  Restart vancomycin 1g IV q 24 hrs.  1st dose now. F/u renal func, culture data, clinical status.  Tad Moore, BCPS  Clinical Pharmacist Pager [phone]  02/28/2013 10:02 AM

## 2013-02-28 NOTE — Progress Notes (Signed)
Came by to examine arm Much more drainage Don't feel fluctuant areas but arm remains very swollen  Would like to get an MRI of the area to make sure there isn't a large deep abscess that I am not appreciating Continue iv abx for now Cultures pending

## 2013-02-28 NOTE — Progress Notes (Signed)
No current order for pt to be seen by surgeon. MD notified, will continue to monitor.

## 2013-02-28 NOTE — Progress Notes (Signed)
Clinical Child psychotherapist (CSW) notified pt insurance company that pt is not medically ready for discharge. CSW to remain following for placement to [facility].  Theresia Bough, MSW, Theresia Majors [phone]

## 2013-02-28 NOTE — Consult Note (Signed)
Subjective:     Wanda Logan is an 80 year old lady who was admitted to the hospital on 02/25/13 for right arm discomfort. She was complaining of some redness and pain when she would rest her arm on a surface. She has been treated with Cipro 750 for a while then vancomycin and things seem to be improving until she had significant drainage last night at 4 AM. Since that time she has had some subsequent drainage and a culture has been sent to the lab. She is not really complaining of pain with motion of her shoulder elbow wrist. Her white count is currently 11.7 and she is afebrile. She does not appear to be acutely ill. Regular x-rays of the right elbow taken on 02/25/13 showed no bony involvement.   Activity level:  Out of bed sitting in a chair Diet tolerance:  Okay Voiding:  Okay Patient reports pain as 3 on 0-10 scale.    Objective: Vital signs in last 24 hours: Temp:  [97.9 F (36.6 C)-98.4 F (36.9 C)] 97.9 F (36.6 C) (06/05 1023) Pulse Rate:  [58-69] 58 (06/05 1023) Resp:  [18] 18 (06/05 1023) BP: (106-131)/(56-69) 106/69 mmHg (06/05 1023) SpO2:  [95 %-96 %] 95 % (06/05 1023) Weight:  [64.399 kg (141 lb 15.6 oz)] 64.399 kg (141 lb 15.6 oz) (06/04 2034)  Labs:  Recent Labs  02/26/13 0645 02/27/13 0630  HGB 12.0 10.9*    Recent Labs  02/26/13 0645 02/27/13 0630  WBC 13.7* 11.7*  RBC 3.82* 3.48*  HCT 34.4* 31.6*  PLT 140* 154   No results found for this basename: NA, K, CL, CO2, BUN, CREATININE, GLUCOSE, CALCIUM,  in the last 72 hours No results found for this basename: LABPT, INR,  in the last 72 hours  Physical Exam:  Neurologically intact Neurovascular intact Sensation intact distally Intact pulses distally Right upper extremity examination: Shoulder and wrist motion. Elbow motion is 10 -- 100. Her right arm as warm with some erythema. The forearm seems to be moderately swollen with an area of drainage about 1 cm in size on the posterolateral aspect of the elbow or just  distal. I have painted this area with Betadine and probed it with a cotton-tipped applicator. The depth is less than 1 cm and there was no significant purulence noted.  Assessment/Plan:      assessment: Right forearm cellulitis with spontaneous abscess drainage.  Plan: Continue with IV vancomycin. Cultures pending. Continue dressing change every 6 hours. No surgery planned this evening. Will recheck her arm later this evening.     Jabir Dahlem R 02/28/2013, 2:30 PM

## 2013-02-28 NOTE — Progress Notes (Signed)
[facility] PROGRESS NOTE  CAROLLYN ETCHEVERRY WUJ:[id number] DOB: [birth year] DOA: 02/25/2013 PCP: Emeterio Reeve, MD  HPI/Subjective: Daughter at bedside. Abscesses around the right elbow draining a lot of pus  Brief narrative 79 y.o. female with PMH of moderate dementia, HTN, TIA, HL, and gout resides at home with 24/7 care givers, presented to the [facility] ED on 02/25/13 with complaints of fever, pain, redness and swelling of right upper extremity. Patient is unable to provide history secondary to dementia. History obtained from 2 daughters and a son at bedside. Patient has history of recurrent falls. She was found to have features suggestive of right upper extremity cellulitis & admitted for further evaluation.  Assessment/Plan:  Right upper extremity cellulitis:  -After patient was scheduled for discharge patient was complaining about a lot of pain. -She has a pustule which started to drain, I think she has big underlying abscess. -Ciprofloxacin discontinued, patient started on vancomycin. -Orthopedics surgery to see for further recommendations.  Dehydration: Secondary to poor oral intake. Completed brief IV fluids.  Frequent falls: Likely secondary to generalized weakness and dementia. PT and OT evaluation. Family are contemplating SNF.  Hypertension: Controlled. Continue atenolol.  Hyperlipidemia: Statins discontinued by PCP.  TIA: Continue Aggrenox.  Dementia: Fairly advanced. Continue Aricept but if diarrhea persists, consider stopping. Patient apparently agitated overnight-took off clothes, trying to get out of bed. Currently family providing 24/7 supervision at bedside. Advised son, that is family cannot provide the same, hospital will try to arrange for a Recruitment consultant. Nursing to change patient to a camera room and bed alarm. Most light he secondary to sundowning. Patient missed outpatient neurology consultation appointment-can be rescheduled after  discharge.  History of gout: Uric acid normal  Subacute sternal fracture: Asymptomatic of chest pain.  Mild thrombocytopenia and leukocytosis: Follow CBC in a.m.   Code Status: Full  Family Communication: Son at bedside Disposition Plan: Possibly SNF- family to decide.   Consultants:  Orthopedics-Dr. Sherlean Foot  Procedures:  None  Antibiotics:  IV Vancomycin 6/2>   .  Objective: Filed Vitals:   02/27/13 1755 02/27/13 2034 02/28/13 0426 02/28/13 1023  BP: 131/63 111/56 126/60 106/69  Pulse: 69 66 64 58  Temp: 97.9 F (36.6 C) 98.3 F (36.8 C) 98.4 F (36.9 C) 97.9 F (36.6 C)  TempSrc: Oral Oral Oral Oral  Resp: 18 18 18 18   Height:  5\' 4"  (1.626 m)    Weight:  64.399 kg (141 lb 15.6 oz)    SpO2: 96% 96% 96% 95%    Intake/Output Summary (Last 24 hours) at 02/28/13 1131 Last data filed at 02/28/13 1000  Gross per 24 hour  Intake    600 ml  Output    950 ml  Net   -350 ml   Filed Weights   02/25/13 2049 02/26/13 2027 02/27/13 2034  Weight: 62.2 kg (137 lb 2 oz) 64.4 kg (141 lb 15.6 oz) 64.399 kg (141 lb 15.6 oz)    Exam:   General exam: Comfortable. Sitting on a reclining chair.  Respiratory system: Clear. No increased work of breathing.  Cardiovascular system: S1 & S2 heard, RRR. No JVD, murmurs, gallops, clicks or pedal edema.  Gastrointestinal system: Abdomen is nondistended, soft and nontender. Normal bowel sounds heard.  Central nervous system: Alert and oriented only to self. No focal neurological deficits.  Extremities: Symmetric 5 x 5 power. Right upper extremity nonpitting boggy edema below mid upper arm to right distal forearm. Erythema now localized to area over elbow  tip with some softness but no real fluctuation. No increased in warmth. Peripheral pulses well felt.   Data Reviewed: Basic Metabolic Panel:  Recent Labs Lab 02/25/13 0819  NA 135  K 3.5  CL 97  CO2 22  GLUCOSE 118*  BUN 27*  CREATININE 0.90  CALCIUM 9.4   Liver  Function Tests:  Recent Labs Lab 02/25/13 0819  AST 21  ALT 10  ALKPHOS 85  BILITOT 1.1  PROT 6.8  ALBUMIN 3.4*   No results found for this basename: LIPASE, AMYLASE,  in the last 168 hours No results found for this basename: AMMONIA,  in the last 168 hours CBC:  Recent Labs Lab 02/25/13 0819 02/26/13 0645 02/27/13 0630  WBC 12.9* 13.7* 11.7*  NEUTROABS 11.6*  --   --   HGB 12.7 12.0 10.9*  HCT 35.9* 34.4* 31.6*  MCV 89.8 90.1 90.8  PLT 153 140* 154   Cardiac Enzymes: No results found for this basename: CKTOTAL, CKMB, CKMBINDEX, TROPONINI,  in the last 168 hours BNP (last 3 results) No results found for this basename: PROBNP,  in the last 8760 hours CBG: No results found for this basename: GLUCAP,  in the last 168 hours  Recent Results (from the past 240 hour(s))  URINE CULTURE     Status: None   Collection Time    02/25/13 10:23 AM      Result Value Range Status   Specimen Description URINE, CLEAN CATCH   Final   Special Requests NONE   Final   Culture  Setup Time 02/25/2013 11:34   Final   Colony Count 45,000 COLONIES/ML   Final   Culture     Final   Value: Multiple bacterial morphotypes present, none predominant. Suggest appropriate recollection if clinically indicated.   Report Status 02/26/2013 FINAL   Final     Studies: No results found.   Additional labs:   Scheduled Meds: . atenolol  50 mg Oral Daily  . celecoxib  200 mg Oral Daily  . dipyridamole-aspirin  1 capsule Oral BID  . donepezil  10 mg Oral QHS  . feeding supplement  237 mL Oral BID BM  . multivitamin with minerals  1 tablet Oral Daily  . vancomycin  1,000 mg Intravenous Q24H   Continuous Infusions:   Principal Problem:   Cellulitis of right upper extremity Active Problems:   Hypertension   TIA (transient ischemic attack)   High blood cholesterol   Dementia   Falls frequently   Dehydration    Time spent: 25 minutes    [facility] A  [facility] Pager  [phone]   If 8PM-8AM, please contact night-coverage at [email or web address], password [facility] 02/28/2013, 11:31 AM  LOS: 3 days

## 2013-02-28 NOTE — Progress Notes (Signed)
Wound culture collected per MD order. Pt abscess drained approx 30 ml purulent yellow, thick drainage. Spoke with orthopedic surgeon, plan to keep pt NPO until they see wound. Will continue to monitor.

## 2013-03-01 LAB — CBC
HCT: 33.2 % — ABNORMAL LOW (ref 36.0–46.0)
MCV: 90 fL (ref 78.0–100.0)
Platelets: 199 10*3/uL (ref 150–400)
RBC: 3.69 MIL/uL — ABNORMAL LOW (ref 3.87–5.11)
WBC: 6.6 10*3/uL (ref 4.0–10.5)

## 2013-03-01 LAB — BASIC METABOLIC PANEL
CO2: 25 mEq/L (ref 19–32)
Chloride: 106 mEq/L (ref 96–112)
GFR calc Af Amer: 88 mL/min — ABNORMAL LOW (ref >90)
Sodium: 142 mEq/L (ref 135–145)

## 2013-03-01 MED ORDER — VANCOMYCIN HCL IN DEXTROSE 1-5 GM/200ML-% IV SOLN: 1000.0000 mg | INTRAVENOUS | Status: DC

## 2013-03-01 MED ORDER — POTASSIUM CHLORIDE CRYS ER 20 MEQ PO TBCR
40.0000 meq | EXTENDED_RELEASE_TABLET | Freq: Four times a day (QID) | ORAL | Status: AC
  Administered 2013-03-01 (×2): 40 meq via ORAL
  Filled 2013-03-01 (×2): qty 2

## 2013-03-01 MED ORDER — TRAMADOL HCL 50 MG PO TABS: 50.0000 mg | ORAL_TABLET | Freq: Four times a day (QID) | ORAL | Status: DC | PRN

## 2013-03-01 NOTE — Progress Notes (Signed)
Physical Therapy Treatment Patient Details Name: Wanda Logan MRN: [id number] DOB: 1925/03/18 Today's Date: 03/01/2013 Time: 4098-1191 PT Time Calculation (min): 16 min  PT Assessment / Plan / Recommendation Comments on Treatment Session  Patient making slow improvements with gait.  Balance remains decreased in standing.    Follow Up Recommendations  SNF     Does the patient have the potential to tolerate intense rehabilitation     Barriers to Discharge        Equipment Recommendations  None recommended by PT    Recommendations for Other Services    Frequency Min 3X/week   Plan Discharge plan remains appropriate;Frequency remains appropriate    Precautions / Restrictions Precautions Precautions: Fall Restrictions Weight Bearing Restrictions: No   Pertinent Vitals/Pain     Mobility  Bed Mobility Bed Mobility: Not assessed (Patient sitting EOB with nursing as PT entered room) Transfers Transfers: Sit to Stand;Stand to Sit Sit to Stand: 3: Mod assist;With upper extremity assist;From bed Stand to Sit: 3: Mod assist;With upper extremity assist;With armrests;To chair/3-in-1 Details for Transfer Assistance: Verbal and tactile cues for hand placement.  Assist to rise to standing.  Patient leaning to left in standing. Ambulation/Gait Ambulation/Gait Assistance: 3: Mod assist (+1 to bring chair behind patient) Ambulation Distance (Feet): 20 Feet Assistive device: Rolling walker Ambulation/Gait Assistance Details: Verbal cues for patient to begin ambulation.  Required assist to maneuver RW.  Patient continued to lean to left during gait - mod assist for balance.   Gait Pattern: Step-through pattern;Decreased step length - right;Decreased step length - left;Decreased weight shift to right;Shuffle;Trunk flexed Gait velocity: slow gait speed      PT Goals Acute Rehab PT Goals Pt will go Sit to Stand: with min assist PT Goal: Sit to Stand - Progress: Progressing toward goal Pt  will Transfer Bed to Chair/Chair to Bed: with min assist PT Transfer Goal: Bed to Chair/Chair to Bed - Progress: Progressing toward goal Pt will Ambulate: 1 - 15 feet;with min assist;with rolling walker PT Goal: Ambulate - Progress: Progressing toward goal  Visit Information  Last PT Received On: 03/01/13 Assistance Needed: +2    Subjective Data  Subjective: "I'll try" (to walk)   Cognition  Cognition Arousal/Alertness: Awake/alert Behavior During Therapy: WFL for tasks assessed/performed Overall Cognitive Status: History of cognitive impairments - at baseline    Balance  Balance Balance Assessed: Yes Static Standing Balance Static Standing - Balance Support: Bilateral upper extremity supported Static Standing - Level of Assistance: 3: Mod assist Static Standing - Comment/# of Minutes: 2 minutes.  Leaning to left.  Verbal and tactile cues to shift weight to right.  End of Session PT - End of Session Equipment Utilized During Treatment: Gait belt Activity Tolerance: Patient tolerated treatment well Patient left: in chair;with call bell/phone within reach;with family/visitor present Nurse Communication: Mobility status   GP     Vena Austria 03/01/2013, 3:50 PM Durenda Hurt. Renaldo Fiddler, [employer] Acute Rehab Services Pager [phone]

## 2013-03-01 NOTE — Progress Notes (Signed)
CSW  Contacted Rhonda at [facility] to inform of Pt's possible d/c to the facility today. Pt is able to go to facility and all paperwork has been completed by the daughter.   If Pt were not able to d/c today, Facility can take Pt tomorrow if they have the d/c summary this afternoon.   Covering CSW will follow for d/c planning and discharge.   Leron Croak, LCSWA [facility] Emergency Dept.  [phone] \

## 2013-03-01 NOTE — Progress Notes (Signed)
MRI called regarding test, states unit has been backed up and that they will try to get to her next. Will continue to monitor. Pt and family updated.

## 2013-03-01 NOTE — Progress Notes (Signed)
Wound care done. Pt dressing completely saturated with yellow exudate. Wound actively draining approx 35 cc yellow and clear drainage. Irrigated with NS and thick drainage removed with betadine swab. Replaced dressing and wrapped with gauze (tape is irritating arm). Will continue to monitor.

## 2013-03-01 NOTE — Progress Notes (Signed)
NUTRITION FOLLOW UP  DOCUMENTATION CODES  Per approved criteria   -Severe malnutrition in the context of chronic illness    Intervention:   1. Continue Ensure Complete po BID, each supplement provides 350 kcal and 13 grams of protein.  2. RD to continue to follow nutrition care plan.  Nutrition Dx:   Inadequate oral intake related to recent overall medical decline as evidenced by ongoing weight loss and poor oral intake. Ongoing.  Goal:   Intake to meet >90% of estimated nutrition needs. Unmet.  Monitor:   weight trends, lab trends, I/O's, PO intake, supplement tolerance  Assessment:   Pt with frequent UTIs since December 2013. Also with severe mental status decline, requiring 24-hour care. Admitted with fever, pain and swelling of upper R extremity. Work-up reveals cellulitis.  Pt was supposed to d/c 6/4 however d/c held 2/2 increased RUE swelling. Also with increased drainage of area. MRI today. Plan to d/c to SNF once stable.  Continues on Heart Healthy diet. Ensure Complete ordered for BID. Intake is poor however it is patient's baseline, confirmed with caregiver. Eating 25-50% of meals, taking supplements with medications.  Pt meets criteria for severe MALNUTRITION in the context of chronic illness as evidenced by <75% of estimated energy intake x at least 1 month and 14% wt loss x 4 months.   Height: Ht Readings from Last 1 Encounters:  02/27/13 5\' 4"  (1.626 m)    Weight Status:   Wt Readings from Last 1 Encounters:  02/28/13 141 lb 5 oz (64.1 kg)    Re-estimated needs:  Kcal: 1375 - 1500 Protein: 70 - 80 grams Fluid: at least 1.5 liters daily  Skin: skin tear R leg  Diet Order: Cardiac   Intake/Output Summary (Last 24 hours) at 03/01/13 1037 Last data filed at 03/01/13 0658  Gross per 24 hour  Intake      0 ml  Output    550 ml  Net   -550 ml    Last BM: 6/6   Labs:   Recent Labs Lab 02/25/13 0819 03/01/13 0640  NA 135 142  K 3.5 3.4*  CL 97  106  CO2 22 25  BUN 27* 19  CREATININE 0.90 0.73  CALCIUM 9.4 9.2  GLUCOSE 118* 94    CBG (last 3)  No results found for this basename: GLUCAP,  in the last 72 hours  Scheduled Meds: . atenolol  50 mg Oral Daily  . celecoxib  200 mg Oral Daily  . dipyridamole-aspirin  1 capsule Oral BID  . donepezil  10 mg Oral QHS  . feeding supplement  237 mL Oral BID BM  . multivitamin with minerals  1 tablet Oral Daily  . vancomycin  1,000 mg Intravenous Q24H    Continuous Infusions:  none  Jarold Motto MS, RD, LDN Pager: [phone] After-hours pager: [phone]

## 2013-03-01 NOTE — Progress Notes (Signed)
CSW received a call from [place] at [facility] [phone]) about update on Pt status.   CSW contacted caller back for update concerning pending MRI results for d/c disposition.  Message left on caller's VM.   Covering CSW to follow for d/c planning.   Leron Croak, LCSWA [facility] Emergency Dept.  [phone]

## 2013-03-01 NOTE — Progress Notes (Signed)
[facility] PROGRESS NOTE  Wanda Logan JYN:[id number] DOB: Jul 28, 1925 DOA: 02/25/2013 PCP: Emeterio Reeve, MD  HPI/Subjective: Daughter at bedside. Seen by orthopedics, waiting on MRI.  Brief narrative 79 y.o. female with PMH of moderate dementia, HTN, TIA, HL, and gout resides at home with 24/7 care givers, presented to the [facility] ED on 02/25/13 with complaints of fever, pain, redness and swelling of right upper extremity. Patient is unable to provide history secondary to dementia. History obtained from 2 daughters and a son at bedside. Patient has history of recurrent falls. She was found to have features suggestive of right upper extremity cellulitis & admitted for further evaluation.  Assessment/Plan:  Right upper extremity cellulitis:  -After patient was scheduled for discharge patient was complaining about a lot of pain. -She has a pustule which started to drain, I think she has big underlying abscess. -Ciprofloxacin discontinued, patient started on vancomycin. Leukocytosis resolved. -Orthopedics surgery recommended MRI.  Dehydration: Secondary to poor oral intake. Completed brief IV fluids.  Frequent falls: Likely secondary to generalized weakness and dementia. PT and OT evaluation. Family are contemplating SNF.  Hypertension: Controlled. Continue atenolol.  Hyperlipidemia: Statins discontinued by PCP.  TIA: Continue Aggrenox.  Dementia: Fairly advanced. Continue Aricept but if diarrhea persists, consider stopping. Patient apparently agitated overnight-took off clothes, trying to get out of bed. Currently family providing 24/7 supervision at bedside. Advised son, that is family cannot provide the same, hospital will try to arrange for a Recruitment consultant. Nursing to change patient to a camera room and bed alarm. Most light he secondary to sundowning. Patient missed outpatient neurology consultation appointment-can be rescheduled after discharge.  History of gout:  Uric acid normal  Subacute sternal fracture: Asymptomatic of chest pain.  Mild thrombocytopenia and leukocytosis: Follow CBC in a.m.   Code Status: Full  Family Communication: Son at bedside Disposition Plan: Possibly SNF- family to decide.   Consultants:  Orthopedics-Dr. Sherlean Foot  Procedures:  None  Antibiotics:  IV Vancomycin 6/2>   .  Objective: Filed Vitals:   02/28/13 1704 02/28/13 2123 03/01/13 0500 03/01/13 0937  BP: 150/89 145/80 174/72 111/54  Pulse: 68 65 58 67  Temp: 97.3 F (36.3 C) 98.5 F (36.9 C) 97.9 F (36.6 C) 97.3 F (36.3 C)  TempSrc: Oral Oral Oral Oral  Resp: 18 19 20 18   Height:      Weight:  64.1 kg (141 lb 5 oz)    SpO2: 93% 96% 94% 94%    Intake/Output Summary (Last 24 hours) at 03/01/13 1227 Last data filed at 03/01/13 0830  Gross per 24 hour  Intake    240 ml  Output    550 ml  Net   -310 ml   Filed Weights   02/26/13 2027 02/27/13 2034 02/28/13 2123  Weight: 64.4 kg (141 lb 15.6 oz) 64.399 kg (141 lb 15.6 oz) 64.1 kg (141 lb 5 oz)    Exam:   General exam: Comfortable. Sitting on a reclining chair.  Respiratory system: Clear. No increased work of breathing.  Cardiovascular system: S1 & S2 heard, RRR. No JVD, murmurs, gallops, clicks or pedal edema.  Gastrointestinal system: Abdomen is nondistended, soft and nontender. Normal bowel sounds heard.  Central nervous system: Alert and oriented only to self. No focal neurological deficits.  Extremities: Symmetric 5 x 5 power. Right upper extremity nonpitting boggy edema below mid upper arm to right distal forearm. Erythema now localized to area over elbow tip with some softness but no real fluctuation.  No increased in warmth. Peripheral pulses well felt.   Data Reviewed: Basic Metabolic Panel:  Recent Labs Lab 02/25/13 0819 03/01/13 0640  NA 135 142  K 3.5 3.4*  CL 97 106  CO2 22 25  GLUCOSE 118* 94  BUN 27* 19  CREATININE 0.90 0.73  CALCIUM 9.4 9.2   Liver  Function Tests:  Recent Labs Lab 02/25/13 0819  AST 21  ALT 10  ALKPHOS 85  BILITOT 1.1  PROT 6.8  ALBUMIN 3.4*   No results found for this basename: LIPASE, AMYLASE,  in the last 168 hours No results found for this basename: AMMONIA,  in the last 168 hours CBC:  Recent Labs Lab 02/25/13 0819 02/26/13 0645 02/27/13 0630 03/01/13 0640  WBC 12.9* 13.7* 11.7* 6.6  NEUTROABS 11.6*  --   --   --   HGB 12.7 12.0 10.9* 11.3*  HCT 35.9* 34.4* 31.6* 33.2*  MCV 89.8 90.1 90.8 90.0  PLT 153 140* 154 199   Cardiac Enzymes: No results found for this basename: CKTOTAL, CKMB, CKMBINDEX, TROPONINI,  in the last 168 hours BNP (last 3 results) No results found for this basename: PROBNP,  in the last 8760 hours CBG: No results found for this basename: GLUCAP,  in the last 168 hours  Recent Results (from the past 240 hour(s))  URINE CULTURE     Status: None   Collection Time    02/25/13 10:23 AM      Result Value Range Status   Specimen Description URINE, CLEAN CATCH   Final   Special Requests NONE   Final   Culture  Setup Time 02/25/2013 11:34   Final   Colony Count 45,000 COLONIES/ML   Final   Culture     Final   Value: Multiple bacterial morphotypes present, none predominant. Suggest appropriate recollection if clinically indicated.   Report Status 02/26/2013 FINAL   Final  WOUND CULTURE     Status: None   Collection Time    02/28/13 12:55 PM      Result Value Range Status   Specimen Description WOUND ARM RIGHT   Final   Special Requests NONE   Final   Gram Stain     Final   Value: FEW WBC PRESENT,BOTH PMN AND MONONUCLEAR     RARE SQUAMOUS EPITHELIAL CELLS PRESENT     RARE GRAM POSITIVE COCCI     IN PAIRS   Culture Culture reincubated for better growth   Final   Report Status PENDING   Incomplete     Studies: No results found.   Additional labs:   Scheduled Meds: . atenolol  50 mg Oral Daily  . celecoxib  200 mg Oral Daily  . dipyridamole-aspirin  1 capsule  Oral BID  . donepezil  10 mg Oral QHS  . feeding supplement  237 mL Oral BID BM  . multivitamin with minerals  1 tablet Oral Daily  . vancomycin  1,000 mg Intravenous Q24H   Continuous Infusions:   Principal Problem:   Cellulitis of right upper extremity Active Problems:   Hypertension   TIA (transient ischemic attack)   High blood cholesterol   Dementia   Falls frequently   Dehydration    Time spent: 25 minutes    [facility] A  [facility] Pager [phone]   If 8PM-8AM, please contact night-coverage at [email or web address], password [facility] 03/01/2013, 12:27 PM  LOS: 4 days

## 2013-03-01 NOTE — Progress Notes (Signed)
Clinical Child psychotherapist (CSW) contacted Bjorn Loser at [facility] to notify that pt is awaiting a picc line. Rhonda informed CSW that she has been in touch with Lynnell Catalan at [facility] who confirmed pt is okay to transfer today/tomorrow. CSW has submitted updated dc summary and AVS to facility and placed pt dc packet in shadow chart.  Per Bjorn Loser at [facility] pt okay to transfer (as late at 8pm today or tomorrow) once picc line placed. CSW will notify RN and provide contact information to contact [facility] for transport.  Theresia Bough, MSW, Theresia Majors [phone]

## 2013-03-01 NOTE — Progress Notes (Addendum)
Subjective:    right forearm infection , improvig  Activity level:  oob Diet tolerance:  ok Voiding:  ok Patient reports pain as 1 on 0-10 scale.    Objective: Vital signs in last 24 hours: Temp:  [97.3 F (36.3 C)-98.8 F (37.1 C)] 97.9 F (36.6 C) (06/06 0500) Pulse Rate:  [58-84] 58 (06/06 0500) Resp:  [18-20] 20 (06/06 0500) BP: (106-174)/(69-89) 174/72 mmHg (06/06 0500) SpO2:  [93 %-96 %] 94 % (06/06 0500) Weight:  [64.1 kg (141 lb 5 oz)] 64.1 kg (141 lb 5 oz) (06/05 2123)  Labs:  Recent Labs  02/27/13 0630 03/01/13 0640  HGB 10.9* 11.3*    Recent Labs  02/27/13 0630 03/01/13 0640  WBC 11.7* 6.6  RBC 3.48* 3.69*  HCT 31.6* 33.2*  PLT 154 199   No results found for this basename: NA, K, CL, CO2, BUN, CREATININE, GLUCOSE, CALCIUM,  in the last 72 hours No results found for this basename: LABPT, INR,  in the last 72 hours  Physical Exam:  Neurologically intact Neurovascular intact Sensation intact distally Intact pulses distally Compartment soft Rue much improved Less swelling and redness  Good rom  still with some drainage Temp wnl and wbc- 6.6 Assessment/Plan: r forearm infection, improving Gram stain- Gram+cocci-cultures pending Cont dressing changes and vanc. We will cancel MRI scan due to significant improvement.  Would recommend dressing changes every 6 hours and IV vancomycin x2 weeks.  Return to our office in 2 weeks.     Up with therapy prn    Wanda Logan R 03/01/2013, 7:52 AM

## 2013-03-01 NOTE — Discharge Summary (Signed)
Physician Discharge Summary  Wanda Logan XBJ:[id number] DOB: 12-08-1924 DOA: 02/25/2013  PCP: Emeterio Reeve, MD  Admit date: 02/25/2013 Discharge date: 03/01/2013  Time spent: *40 minutes  Recommendations for Outpatient Follow-up:  1. Followup with orthopedics in 2 weeks  Discharge Diagnoses:  Principal Problem:   Cellulitis of right upper extremity Active Problems:   Hypertension   TIA (transient ischemic attack)   High blood cholesterol   Dementia   Falls frequently   Dehydration   Discharge Condition: Stable  Diet recommendation: Heart healthy diet  Filed Weights   02/26/13 2027 02/27/13 2034 02/28/13 2123  Weight: 64.4 kg (141 lb 15.6 oz) 64.399 kg (141 lb 15.6 oz) 64.1 kg (141 lb 5 oz)    History of present illness:  Wanda Logan is a 79 y.o. female with PMH of moderate dementia, HTN, TIA, HL, and gout resides at home with 24/7 care givers, presented to the [facility] ED on 02/25/13 with complaints of fever, pain, redness and swelling of right upper extremity. Patient is unable to provide history secondary to dementia. History obtained from 2 daughters and a son at bedside. Patient has history of recurrent falls. Prior to 4 days ago, she was ambulating well with the help of a walker at home. In the last 4 days, patient has become progressively weaker, poor appetite, unable to stand up and has fallen twice. No reported head injury or loss of consciousness. Nonproductive cough with no dyspnea or chest pain. Nausea but no vomiting, abdominal pain. Poor oral intake. 1 loose BM daily since starting Aricept a month ago. Odor to the urine but no dysuria or urinary frequency. Since 02/24/13, noted swelling of right upper extremity/elbow region. Today, caregivers noted worsening of pain, swelling, redness with associated fever (not documented). EMS was called and patient was brought to the ED were chest x-ray negative, right elbow x-ray negative for fractures, mildly elevated WBC  and unimpressive UA. She was started on IV vancomycin for possible right upper extremity cellulitis and hospitalist admission was requested.  Hospital Course:   1. Right upper extremity cellulitis: Patient admitted to the hospital for further evaluation, she was initially started on ciprofloxacin, and her cellulitis was improving. Patient did very well and on the day she was scheduled to be discharged, she has 3 pustules popped up and started to drain pus. The pus drainage was in large amounts suggesting underlying abscess. Patient discharge was held, orthopedics was consulted. Initially they recommended MRI, the patient after the drainage of the abscess and institution of IV antibiotics did very well, her white blood cells went down from around 12,000 to normal. Then the MRI was canceled and Dr. Jerl Santos recommended 14 days of vancomycin and to followup with him in 2 weeks.  2. Dehydration: Secondary to poor oral intake, upon admission patient has prerenal azotemia with high BUN and high normal creatinine patient hydrated aggressively with IV fluids, now off of IV fluids and her BUN/creatinine is back to normal.  3. Frequent falls likely secondary to generalized weakness and dementia, PT OT evaluated the patient and recommended skilled nursing facility.  4. Dementia: Without behavioral disturbances, but fairly advanced dementia. Patient is on Aricept, that was continued throughout the hospital stay. Had some confusion/agitation towards night on the first of admission, that was probably secondary to sundowning, this is resolved now completely.  5. Hypertension: Controlled, her preadmission home medication was continued throughout the hospital stay.  Procedures:  Placement of PICC line by IV team  Consultations:  [place] of orthopedics surgery  Discharge Exam: Filed Vitals:   02/28/13 1704 02/28/13 2123 03/01/13 0500 03/01/13 0937  BP: 150/89 145/80 174/72 111/54  Pulse: 68 65 58 67  Temp:  97.3 F (36.3 C) 98.5 F (36.9 C) 97.9 F (36.6 C) 97.3 F (36.3 C)  TempSrc: Oral Oral Oral Oral  Resp: 18 19 20 18   Height:      Weight:  64.1 kg (141 lb 5 oz)    SpO2: 93% 96% 94% 94%   General: Alert and awake, oriented x3, not in any acute distress. HEENT: anicteric sclera, pupils reactive to light and accommodation, EOMI CVS: S1-S2 clear, no murmur rubs or gallops Chest: clear to auscultation bilaterally, no wheezing, rales or rhonchi Abdomen: soft nontender, nondistended, normal bowel sounds, no organomegaly Extremities: no cyanosis, clubbing or edema noted bilaterally Neuro: Cranial nerves II-XII intact, no focal neurological deficits  Discharge Instructions  Discharge Orders   Future Orders Complete By Expires     Call MD for:  severe uncontrolled pain  As directed     Call MD for:  temperature >100.4  As directed     Diet - low sodium heart healthy  As directed     Discharge instructions  As directed     Comments:      Please be sure to follow up with your pcp at the SNF.  Also you will need to follow up with in 2 weeks with Dr. Sherlean Foot.  Cipro should be taken for the next 10 days    Increase activity slowly  As directed         Medication List    TAKE these medications       acetaminophen 325 MG tablet  Commonly known as:  TYLENOL  Take 650 mg by mouth daily as needed for pain.     atenolol 50 MG tablet  Commonly known as:  TENORMIN  Take 50 mg by mouth daily.     celecoxib 200 MG capsule  Commonly known as:  CELEBREX  Take 200 mg by mouth daily.     dipyridamole-aspirin 200-25 MG per 12 hr capsule  Commonly known as:  AGGRENOX  Take 1 capsule by mouth 2 (two) times daily.     donepezil 10 MG tablet  Commonly known as:  ARICEPT  Take 10 mg by mouth at bedtime as needed.     feeding supplement Liqd  Take 237 mLs by mouth 2 (two) times daily between meals.     traMADol 50 MG tablet  Commonly known as:  ULTRAM  Take 1 tablet (50 mg total) by mouth  every 6 (six) hours as needed for pain.     vancomycin 1 GM/200ML Soln  Commonly known as:  VANCOCIN  Inject 200 mLs (1,000 mg total) into the vein daily.       Allergies  Allergen Reactions  . Aspirin     REACTION: ULCER  . Atorvastatin   . Penicillins        Follow-up Information   Follow up with Velna Ochs, MD In 2 weeks.   Contact information:   [address] [place] [address] [phone]        The results of significant diagnostics from this hospitalization (including imaging, microbiology, ancillary and laboratory) are listed below for reference.    Significant Diagnostic Studies: Dg Chest 2 View  02/25/2013   *RADIOLOGY REPORT*  Clinical Data: Fever, frequent falls  CHEST - 2 VIEW  Comparison: December 17, 2012.  Findings: Cardiomediastinal silhouette appears normal.  No acute pulmonary disease is noted.  Minimal scarring is seen throughout both lungs which is unchanged compared to prior exam.  No pleural effusion or pneumothorax is noted. However, there does appear to be a moderately displaced sternal fracture with some callus formation suggesting subacute fracture.  IMPRESSION: Moderately displaced sternal fracture seen on lateral radiograph with callus formation suggesting subacute fracture.  No acute pulmonary disease is noted.   Original Report Authenticated By: Lupita Raider.,  M.D.   Dg Elbow Complete Right  02/25/2013   *RADIOLOGY REPORT*  Clinical Data: Fall.  Redness and swelling about the right elbow. History of gout.  RIGHT ELBOW - COMPLETE 3+ VIEW  Comparison: None.  Findings: No fracture, dislocation or joint effusion is identified. Mild enthesopathic change is seen at the common extensor origin. No foreign body or soft tissue gas collection is identified.  IMPRESSION: No acute finding.   Original Report Authenticated By: Holley Dexter, M.D.    Microbiology: Recent Results (from the past 240 hour(s))  URINE CULTURE     Status: None   Collection  Time    02/25/13 10:23 AM      Result Value Range Status   Specimen Description URINE, CLEAN CATCH   Final   Special Requests NONE   Final   Culture  Setup Time 02/25/2013 11:34   Final   Colony Count 45,000 COLONIES/ML   Final   Culture     Final   Value: Multiple bacterial morphotypes present, none predominant. Suggest appropriate recollection if clinically indicated.   Report Status 02/26/2013 FINAL   Final  WOUND CULTURE     Status: None   Collection Time    02/28/13 12:55 PM      Result Value Range Status   Specimen Description WOUND ARM RIGHT   Final   Special Requests NONE   Final   Gram Stain     Final   Value: FEW WBC PRESENT,BOTH PMN AND MONONUCLEAR     RARE SQUAMOUS EPITHELIAL CELLS PRESENT     RARE GRAM POSITIVE COCCI     IN PAIRS   Culture Culture reincubated for better growth   Final   Report Status PENDING   Incomplete     Labs: Basic Metabolic Panel:  Recent Labs Lab 02/25/13 0819 03/01/13 0640  NA 135 142  K 3.5 3.4*  CL 97 106  CO2 22 25  GLUCOSE 118* 94  BUN 27* 19  CREATININE 0.90 0.73  CALCIUM 9.4 9.2   Liver Function Tests:  Recent Labs Lab 02/25/13 0819  AST 21  ALT 10  ALKPHOS 85  BILITOT 1.1  PROT 6.8  ALBUMIN 3.4*   No results found for this basename: LIPASE, AMYLASE,  in the last 168 hours No results found for this basename: AMMONIA,  in the last 168 hours CBC:  Recent Labs Lab 02/25/13 0819 02/26/13 0645 02/27/13 0630 03/01/13 0640  WBC 12.9* 13.7* 11.7* 6.6  NEUTROABS 11.6*  --   --   --   HGB 12.7 12.0 10.9* 11.3*  HCT 35.9* 34.4* 31.6* 33.2*  MCV 89.8 90.1 90.8 90.0  PLT 153 140* 154 199   Cardiac Enzymes: No results found for this basename: CKTOTAL, CKMB, CKMBINDEX, TROPONINI,  in the last 168 hours BNP: BNP (last 3 results) No results found for this basename: PROBNP,  in the last 8760 hours CBG: No results found for this basename: GLUCAP,  in the last 168 hours  Signed:  Pammie Chirino A  [facility] 03/01/2013, 1:58 PM

## 2013-03-02 ENCOUNTER — Inpatient Hospital Stay (HOSPITAL_COMMUNITY): Payer: Medicare Other

## 2013-03-02 LAB — RENAL FUNCTION PANEL
Albumin: 2.8 g/dL — ABNORMAL LOW (ref 3.5–5.2)
Calcium: 9.6 mg/dL (ref 8.4–10.5)
GFR calc Af Amer: 89 mL/min — ABNORMAL LOW (ref >90)
GFR calc non Af Amer: 77 mL/min — ABNORMAL LOW (ref >90)
Glucose, Bld: 98 mg/dL (ref 70–99)
Phosphorus: 3.1 mg/dL (ref 2.3–4.6)
Sodium: 140 mEq/L (ref 135–145)

## 2013-03-02 MED ORDER — SODIUM CHLORIDE 0.9 % IJ SOLN: 10.0000 mL | INTRAMUSCULAR | Status: DC | PRN

## 2013-03-02 MED ORDER — SODIUM CHLORIDE 0.9 % IJ SOLN: 10.0000 mL | Freq: Two times a day (BID) | INTRAMUSCULAR | Status: DC

## 2013-03-02 NOTE — Progress Notes (Signed)
Peripherally Inserted Central Catheter/Midline Placement  The IV Nurse has discussed with the patient and/or persons authorized to consent for the patient, the purpose of this procedure and the potential benefits and risks involved with this procedure.  The benefits include less needle sticks, lab draws from the catheter and patient may be discharged home with the catheter.  Risks include, but not limited to, infection, bleeding, blood clot (thrombus formation), and puncture of an artery; nerve damage and irregular heat beat.  Alternatives to this procedure were also discussed.  PICC/Midline Placement Documentation  PICC / Midline Single Lumen 03/02/13 PICC Right Cephalic (Active)    Consent sign by daughter   Ethelda Chick 03/02/2013, 1:27 PM

## 2013-03-02 NOTE — Progress Notes (Signed)
[facility] PROGRESS NOTE  Wanda Logan UYQ:[id number] DOB: March 12, 1925 DOA: 02/25/2013 PCP: Emeterio Reeve, MD  HPI/Subjective: Denies any complaint, waiting for PICC to be placed prior to D/C.  Brief narrative 79 y.o. female with PMH of moderate dementia, HTN, TIA, HL, and gout resides at home with 24/7 care givers, presented to the [facility] ED on 02/25/13 with complaints of fever, pain, redness and swelling of right upper extremity. Patient is unable to provide history secondary to dementia. History obtained from 2 daughters and a son at bedside. Patient has history of recurrent falls. She was found to have features suggestive of right upper extremity cellulitis & admitted for further evaluation.  Assessment/Plan:  Right upper extremity cellulitis:  -After patient was scheduled for discharge patient was complaining about a lot of pain. -She has a pustule which started to drain, I think she has big underlying abscess. -Ciprofloxacin discontinued, patient started on vancomycin. Leukocytosis resolved. -Orthopedics surgery recommended IV ABX for 2 weeks  Dehydration: Secondary to poor oral intake. Completed brief IV fluids.  Frequent falls: Likely secondary to generalized weakness and dementia. PT and OT evaluation. Family are contemplating SNF.  Hypertension: Controlled. Continue atenolol.  Hyperlipidemia: Statins discontinued by PCP.  TIA: Continue Aggrenox.  Dementia: Fairly advanced. Continue Aricept but if diarrhea persists, consider stopping. Patient apparently agitated overnight-took off clothes, trying to get out of bed. Currently family providing 24/7 supervision at bedside. Advised son, that is family cannot provide the same, hospital will try to arrange for a Recruitment consultant. Nursing to change patient to a camera room and bed alarm. Most light he secondary to sundowning. Patient missed outpatient neurology consultation appointment-can be rescheduled after  discharge.  History of gout: Uric acid normal  Subacute sternal fracture: Asymptomatic of chest pain.  Mild thrombocytopenia and leukocytosis: Follow CBC in a.m.   Code Status: Full  Family Communication: Son at bedside Disposition Plan: SNF today, if picc placed   Consultants:  Orthopedics-Dr. Jerl Santos  Procedures:  None  Antibiotics:  IV Vancomycin 6/2>   .  Objective: Filed Vitals:   03/01/13 1340 03/01/13 1731 03/01/13 2029 03/02/13 0541  BP: 113/56 164/85 170/95 134/74  Pulse: 66 65 75 78  Temp: 97.8 F (36.6 C) 98 F (36.7 C) 98.1 F (36.7 C) 97.7 F (36.5 C)  TempSrc: Oral Oral Oral Oral  Resp: 19 18 18 18   Height:   5\' 4"  (1.626 m)   Weight:   64.099 kg (141 lb 5 oz)   SpO2: 97% 99% 99% 100%    Intake/Output Summary (Last 24 hours) at 03/02/13 1058 Last data filed at 03/02/13 0542  Gross per 24 hour  Intake   1520 ml  Output    400 ml  Net   1120 ml   Filed Weights   02/27/13 2034 02/28/13 2123 03/01/13 2029  Weight: 64.399 kg (141 lb 15.6 oz) 64.1 kg (141 lb 5 oz) 64.099 kg (141 lb 5 oz)    Exam:   General exam: Comfortable. Sitting on a reclining chair.  Respiratory system: Clear. No increased work of breathing.  Cardiovascular system: S1 & S2 heard, RRR. No JVD, murmurs, gallops, clicks or pedal edema.  Gastrointestinal system: Abdomen is nondistended, soft and nontender. Normal bowel sounds heard.  Central nervous system: Alert and oriented only to self. No focal neurological deficits.  Extremities: Symmetric 5 x 5 power. Right upper extremity nonpitting boggy edema below mid upper arm to right distal forearm. Erythema now localized to area over  elbow tip with some softness but no real fluctuation. No increased in warmth. Peripheral pulses well felt.   Data Reviewed: Basic Metabolic Panel:  Recent Labs Lab 02/25/13 0819 03/01/13 0640 03/02/13 0425  NA 135 142 140  K 3.5 3.4* 4.2  CL 97 106 105  CO2 22 25 24   GLUCOSE 118*  94 98  BUN 27* 19 17  CREATININE 0.90 0.73 0.70  CALCIUM 9.4 9.2 9.6  PHOS  --   --  3.1   Liver Function Tests:  Recent Labs Lab 02/25/13 0819 03/02/13 0425  AST 21  --   ALT 10  --   ALKPHOS 85  --   BILITOT 1.1  --   PROT 6.8  --   ALBUMIN 3.4* 2.8*   No results found for this basename: LIPASE, AMYLASE,  in the last 168 hours No results found for this basename: AMMONIA,  in the last 168 hours CBC:  Recent Labs Lab 02/25/13 0819 02/26/13 0645 02/27/13 0630 03/01/13 0640  WBC 12.9* 13.7* 11.7* 6.6  NEUTROABS 11.6*  --   --   --   HGB 12.7 12.0 10.9* 11.3*  HCT 35.9* 34.4* 31.6* 33.2*  MCV 89.8 90.1 90.8 90.0  PLT 153 140* 154 199   Cardiac Enzymes: No results found for this basename: CKTOTAL, CKMB, CKMBINDEX, TROPONINI,  in the last 168 hours BNP (last 3 results) No results found for this basename: PROBNP,  in the last 8760 hours CBG: No results found for this basename: GLUCAP,  in the last 168 hours  Recent Results (from the past 240 hour(s))  URINE CULTURE     Status: None   Collection Time    02/25/13 10:23 AM      Result Value Range Status   Specimen Description URINE, CLEAN CATCH   Final   Special Requests NONE   Final   Culture  Setup Time 02/25/2013 11:34   Final   Colony Count 45,000 COLONIES/ML   Final   Culture     Final   Value: Multiple bacterial morphotypes present, none predominant. Suggest appropriate recollection if clinically indicated.   Report Status 02/26/2013 FINAL   Final  WOUND CULTURE     Status: None   Collection Time    02/28/13 12:55 PM      Result Value Range Status   Specimen Description WOUND ARM RIGHT   Final   Special Requests NONE   Final   Gram Stain     Final   Value: FEW WBC PRESENT,BOTH PMN AND MONONUCLEAR     RARE SQUAMOUS EPITHELIAL CELLS PRESENT     RARE GRAM POSITIVE COCCI     IN PAIRS   Culture     Final   Value: FEW STAPHYLOCOCCUS AUREUS     Note: RIFAMPIN AND GENTAMICIN SHOULD NOT BE USED AS SINGLE DRUGS  FOR TREATMENT OF STAPH INFECTIONS.   Report Status PENDING   Incomplete     Studies: No results found.   Additional labs:   Scheduled Meds: . atenolol  50 mg Oral Daily  . celecoxib  200 mg Oral Daily  . dipyridamole-aspirin  1 capsule Oral BID  . donepezil  10 mg Oral QHS  . feeding supplement  237 mL Oral BID BM  . multivitamin with minerals  1 tablet Oral Daily  . vancomycin  1,000 mg Intravenous Q24H   Continuous Infusions:   Principal Problem:   Cellulitis of right upper extremity Active Problems:   Hypertension   TIA (  transient ischemic attack)   High blood cholesterol   Dementia   Falls frequently   Dehydration    Time spent: 25 minutes    [facility] A  [facility] Pager [phone]   If 8PM-8AM, please contact night-coverage at [email or web address], password [facility] 03/02/2013, 10:58 AM  LOS: 5 days

## 2013-03-02 NOTE — Clinical Social Work Note (Signed)
Clinical Social Worker facilitated patient discharge including contacting patient family and facility to confirm patient discharge plans.  Clinical information faxed to facility and family agreeable with plan.  CSW arranged ambulance transport via [facility] to [place] .  RN to call report prior to discharge.  Clinical Social Worker will sign off for now as social work intervention is no longer needed. Please consult [place] again if new need arises.  Macario Golds, [place] [phone]

## 2013-03-02 NOTE — Discharge Summary (Signed)
Physician Discharge Summary  Wanda Logan:[id number] DOB: 03-14-1925 DOA: 02/25/2013  PCP: Emeterio Reeve, MD  Admit date: 02/25/2013 Discharge date: 03/02/2013  Time spent: 40 minutes  Recommendations for Outpatient Follow-up:  1. Followup with orthopedics in 2 weeks. 2. Per pharmacy recommendation, check vancomycin trough prior to Sunday dose, and adjust vancomycin dosing accordingly.  Discharge Diagnoses:  Principal Problem:   Cellulitis of right upper extremity Active Problems:   Hypertension   TIA (transient ischemic attack)   High blood cholesterol   Dementia   Falls frequently   Dehydration   Discharge Condition: Stable  Diet recommendation: Heart healthy diet  Filed Weights   02/27/13 2034 02/28/13 2123 03/01/13 2029  Weight: 64.399 kg (141 lb 15.6 oz) 64.1 kg (141 lb 5 oz) 64.099 kg (141 lb 5 oz)    History of present illness:  Wanda Logan is a 79 y.o. female with PMH of moderate dementia, HTN, TIA, HL, and gout resides at home with 24/7 care givers, presented to the [facility] ED on 02/25/13 with complaints of fever, pain, redness and swelling of right upper extremity. Patient is unable to provide history secondary to dementia. History obtained from 2 daughters and a son at bedside. Patient has history of recurrent falls. Prior to 4 days ago, she was ambulating well with the help of a walker at home. In the last 4 days, patient has become progressively weaker, poor appetite, unable to stand up and has fallen twice. No reported head injury or loss of consciousness. Nonproductive cough with no dyspnea or chest pain. Nausea but no vomiting, abdominal pain. Poor oral intake. 1 loose BM daily since starting Aricept a month ago. Odor to the urine but no dysuria or urinary frequency. Since 02/24/13, noted swelling of right upper extremity/elbow region. Today, caregivers noted worsening of pain, swelling, redness with associated fever (not documented). EMS was called and  patient was brought to the ED were chest x-ray negative, right elbow x-ray negative for fractures, mildly elevated WBC and unimpressive UA. She was started on IV vancomycin for possible right upper extremity cellulitis and hospitalist admission was requested.  Hospital Course:   1. Right upper extremity cellulitis: Patient admitted to the hospital for further evaluation, she was initially started on ciprofloxacin, and her cellulitis was improving. Patient did very well and on the day she was scheduled to be discharged, she has 3 pustules popped up and started to drain pus. The pus drainage was in large amounts suggesting underlying abscess. Patient discharge was held, orthopedics was consulted. Initially they recommended MRI, the patient after the drainage of the abscess and institution of IV antibiotics did very well, her white blood cells went down from around 12,000 to normal. Then the MRI was canceled and Dr. Jerl Santos recommended 14 days of vancomycin and to followup with him in 2 weeks. On discharge culture showed staph aureus, susceptibilities not back but patient will be on vancomycin for a total 14 days per  2. Dehydration: Secondary to poor oral intake, upon admission patient has prerenal azotemia with high BUN and high normal creatinine patient hydrated aggressively with IV fluids, now off of IV fluids and her BUN/creatinine is back to normal.  3. Frequent falls likely secondary to generalized weakness and dementia, PT OT evaluated the patient and recommended skilled nursing facility.  4. Dementia: Without behavioral disturbances, but fairly advanced dementia. Patient is on Aricept, that was continued throughout the hospital stay. Had some confusion/agitation towards night on the first of  admission, that was probably secondary to sundowning, this is resolved now completely.  5. Hypertension: Controlled, her preadmission home medication was continued throughout the hospital  stay.  Procedures:  Placement of PICC line by IV team  Consultations:  [place] of orthopedics surgery  Discharge Exam: Filed Vitals:   03/01/13 1340 03/01/13 1731 03/01/13 2029 03/02/13 0541  BP: 113/56 164/85 170/95 134/74  Pulse: 66 65 75 78  Temp: 97.8 F (36.6 C) 98 F (36.7 C) 98.1 F (36.7 C) 97.7 F (36.5 C)  TempSrc: Oral Oral Oral Oral  Resp: 19 18 18 18   Height:   5\' 4"  (1.626 m)   Weight:   64.099 kg (141 lb 5 oz)   SpO2: 97% 99% 99% 100%   General: Alert and awake, oriented x3, not in any acute distress. HEENT: anicteric sclera, pupils reactive to light and accommodation, EOMI CVS: S1-S2 clear, no murmur rubs or gallops Chest: clear to auscultation bilaterally, no wheezing, rales or rhonchi Abdomen: soft nontender, nondistended, normal bowel sounds, no organomegaly Extremities: no cyanosis, clubbing or edema noted bilaterally Neuro: Cranial nerves II-XII intact, no focal neurological deficits  Discharge Instructions      Discharge Orders   Future Orders Complete By Expires     Call MD for:  severe uncontrolled pain  As directed     Call MD for:  temperature >100.4  As directed     Diet - low sodium heart healthy  As directed     Discharge instructions  As directed     Comments:      Please be sure to follow up with your pcp at the SNF.  Also you will need to follow up with in 2 weeks with Dr. Sherlean Foot.  Cipro should be taken for the next 10 days    Increase activity slowly  As directed         Medication List    TAKE these medications       acetaminophen 325 MG tablet  Commonly known as:  TYLENOL  Take 650 mg by mouth daily as needed for pain.     atenolol 50 MG tablet  Commonly known as:  TENORMIN  Take 50 mg by mouth daily.     celecoxib 200 MG capsule  Commonly known as:  CELEBREX  Take 200 mg by mouth daily.     dipyridamole-aspirin 200-25 MG per 12 hr capsule  Commonly known as:  AGGRENOX  Take 1 capsule by mouth 2 (two) times  daily.     donepezil 10 MG tablet  Commonly known as:  ARICEPT  Take 10 mg by mouth at bedtime as needed.     feeding supplement Liqd  Take 237 mLs by mouth 2 (two) times daily between meals.     traMADol 50 MG tablet  Commonly known as:  ULTRAM  Take 1 tablet (50 mg total) by mouth every 6 (six) hours as needed for pain.     vancomycin 1 GM/200ML Soln  Commonly known as:  VANCOCIN  Inject 200 mLs (1,000 mg total) into the vein daily.       Allergies  Allergen Reactions  . Aspirin     REACTION: ULCER  . Atorvastatin   . Penicillins    Follow-up Information   Follow up with Velna Ochs, MD In 2 weeks.   Contact information:   [address] [place] [address] [phone]        The results of significant diagnostics from this hospitalization (including imaging,  microbiology, ancillary and laboratory) are listed below for reference.    Significant Diagnostic Studies: Dg Chest 2 View  02/25/2013   *RADIOLOGY REPORT*  Clinical Data: Fever, frequent falls  CHEST - 2 VIEW  Comparison: December 17, 2012.  Findings: Cardiomediastinal silhouette appears normal.  No acute pulmonary disease is noted.  Minimal scarring is seen throughout both lungs which is unchanged compared to prior exam.  No pleural effusion or pneumothorax is noted. However, there does appear to be a moderately displaced sternal fracture with some callus formation suggesting subacute fracture.  IMPRESSION: Moderately displaced sternal fracture seen on lateral radiograph with callus formation suggesting subacute fracture.  No acute pulmonary disease is noted.   Original Report Authenticated By: Lupita Raider.,  M.D.   Dg Elbow Complete Right  02/25/2013   *RADIOLOGY REPORT*  Clinical Data: Fall.  Redness and swelling about the right elbow. History of gout.  RIGHT ELBOW - COMPLETE 3+ VIEW  Comparison: None.  Findings: No fracture, dislocation or joint effusion is identified. Mild enthesopathic change is seen at  the common extensor origin. No foreign body or soft tissue gas collection is identified.  IMPRESSION: No acute finding.   Original Report Authenticated By: Holley Dexter, M.D.    Microbiology: Recent Results (from the past 240 hour(s))  URINE CULTURE     Status: None   Collection Time    02/25/13 10:23 AM      Result Value Range Status   Specimen Description URINE, CLEAN CATCH   Final   Special Requests NONE   Final   Culture  Setup Time 02/25/2013 11:34   Final   Colony Count 45,000 COLONIES/ML   Final   Culture     Final   Value: Multiple bacterial morphotypes present, none predominant. Suggest appropriate recollection if clinically indicated.   Report Status 02/26/2013 FINAL   Final  WOUND CULTURE     Status: None   Collection Time    02/28/13 12:55 PM      Result Value Range Status   Specimen Description WOUND ARM RIGHT   Final   Special Requests NONE   Final   Gram Stain     Final   Value: FEW WBC PRESENT,BOTH PMN AND MONONUCLEAR     RARE SQUAMOUS EPITHELIAL CELLS PRESENT     RARE GRAM POSITIVE COCCI     IN PAIRS   Culture     Final   Value: FEW STAPHYLOCOCCUS AUREUS     Note: RIFAMPIN AND GENTAMICIN SHOULD NOT BE USED AS SINGLE DRUGS FOR TREATMENT OF STAPH INFECTIONS.   Report Status PENDING   Incomplete     Labs: Basic Metabolic Panel:  Recent Labs Lab 02/25/13 0819 03/01/13 0640 03/02/13 0425  NA 135 142 140  K 3.5 3.4* 4.2  CL 97 106 105  CO2 22 25 24   GLUCOSE 118* 94 98  BUN 27* 19 17  CREATININE 0.90 0.73 0.70  CALCIUM 9.4 9.2 9.6  PHOS  --   --  3.1   Liver Function Tests:  Recent Labs Lab 02/25/13 0819 03/02/13 0425  AST 21  --   ALT 10  --   ALKPHOS 85  --   BILITOT 1.1  --   PROT 6.8  --   ALBUMIN 3.4* 2.8*   No results found for this basename: LIPASE, AMYLASE,  in the last 168 hours No results found for this basename: AMMONIA,  in the last 168 hours CBC:  Recent Labs Lab 02/25/13 0819 02/26/13  0645 02/27/13 0630 03/01/13 0640   WBC 12.9* 13.7* 11.7* 6.6  NEUTROABS 11.6*  --   --   --   HGB 12.7 12.0 10.9* 11.3*  HCT 35.9* 34.4* 31.6* 33.2*  MCV 89.8 90.1 90.8 90.0  PLT 153 140* 154 199   Cardiac Enzymes: No results found for this basename: CKTOTAL, CKMB, CKMBINDEX, TROPONINI,  in the last 168 hours BNP: BNP (last 3 results) No results found for this basename: PROBNP,  in the last 8760 hours CBG: No results found for this basename: GLUCAP,  in the last 168 hours     Signed:  Atalya Dano A  [facility] 03/02/2013, 11:02 AM

## 2013-03-03 LAB — WOUND CULTURE

## 2013-03-04 ENCOUNTER — Non-Acute Institutional Stay: Payer: Medicare Other | Admitting: Family Medicine

## 2013-03-04 ENCOUNTER — Non-Acute Institutional Stay (INDEPENDENT_AMBULATORY_CARE_PROVIDER_SITE_OTHER): Payer: Medicare Other | Admitting: Family Medicine

## 2013-03-04 DIAGNOSIS — R269 Unspecified abnormalities of gait and mobility: Secondary | ICD-10-CM | POA: Insufficient documentation

## 2013-03-04 DIAGNOSIS — IMO0002 Reserved for concepts with insufficient information to code with codable children: Secondary | ICD-10-CM

## 2013-03-04 DIAGNOSIS — L03113 Cellulitis of right upper limb: Secondary | ICD-10-CM

## 2013-03-04 DIAGNOSIS — E7800 Pure hypercholesterolemia, unspecified: Secondary | ICD-10-CM

## 2013-03-04 DIAGNOSIS — I10 Essential (primary) hypertension: Secondary | ICD-10-CM

## 2013-03-04 DIAGNOSIS — F0390 Unspecified dementia without behavioral disturbance: Secondary | ICD-10-CM

## 2013-03-04 DIAGNOSIS — G459 Transient cerebral ischemic attack, unspecified: Secondary | ICD-10-CM

## 2013-03-04 DIAGNOSIS — Z9181 History of falling: Secondary | ICD-10-CM

## 2013-03-04 DIAGNOSIS — R296 Repeated falls: Secondary | ICD-10-CM

## 2013-03-04 NOTE — Progress Notes (Signed)
  Subjective:    Patient ID: Wanda Logan, female    DOB: 12-Sep-1925, 79 y.o.   MRN: [id number]  HPI Admitted from [facility] for completion of antibiotic treatment of a   right elbow abscess. She reports minimal pain with movement.   Dementia - Says she's had a stroke in the past but no residual  Gait disorder - Says she has been ambulating with a walker  Disposition - Lives in her own home in [place] with her 4 children looking in on her.   Review of Systems     Objective:   Physical Exam  Constitutional: She appears well-developed and well-nourished.  Cardiovascular: Normal rate and regular rhythm.   No murmur heard. Pulmonary/Chest: Effort normal and breath sounds normal. She has no wheezes. She has no rales.  Musculoskeletal:  R elbow actively flexed and extended and supinated without apparent pain.   Neurological: She is alert.  Skin:  right elbow swollen skin to mid forearm  right olecranon bursa doesn't appear full  Two openings visible near the radial head   Psychiatric: She has a normal mood and affect. Her behavior is normal.  Pleasantly cooperative Registers 3 words, recalls 0 Oriented to year, but not day or day of week.           Assessment & Plan:

## 2013-03-04 NOTE — Assessment & Plan Note (Signed)
Continue treatment with Vancomycin

## 2013-03-04 NOTE — Assessment & Plan Note (Addendum)
Continue Donepezil 

## 2013-03-04 NOTE — Assessment & Plan Note (Signed)
Not currently on medication.

## 2013-03-04 NOTE — Assessment & Plan Note (Signed)
well controlled  

## 2013-03-05 NOTE — Assessment & Plan Note (Signed)
Continue aricept at this time.  No behavioral disturbances noted.

## 2013-03-05 NOTE — Assessment & Plan Note (Signed)
Well controlled, continue atenolol 

## 2013-03-05 NOTE — Assessment & Plan Note (Signed)
On vancomycin with stop date of 6/20.

## 2013-03-05 NOTE — Progress Notes (Signed)
  Subjective:    Patient ID: Wanda Logan, female    DOB: 1924/11/19, 79 y.o.   MRN: [id number]  HPI  79 yo female with history of HTN, dementia and frequent falls admitted to the hospital with R elbow cellulitis and abscess.  She was initially treated with cipro alone but did not have any improvement.  She was subsequently transitioned to vancomycin and has done well since that time.  She denies any pain in her arm.  She has been afebrile.    Dementia: Moderately advanced dementia, no reports of behavioral disturbances.  She is currently on aricept.    Falls: 2/2 to dementia and generalized weakness.  PT/OT consulted during hospitalization and recommended continued PT/OT at SNF.  She reports that she typically ambulates with a  Walker.   History of TIA/Stroke:  No residuals, she is taking aggrenox.    HTN:  Has been well controlled on  Atenolol.     Review of Systems Per HPI    Objective:   Physical Exam  Constitutional:  In bed, pleasant, nad   HENT:  Mouth/Throat: Oropharynx is clear and moist.  Neck: Neck supple.  Cardiovascular: Normal rate and regular rhythm.   Pulmonary/Chest: Effort normal and breath sounds normal. No respiratory distress. She has no wheezes.  Abdominal: Soft. Bowel sounds are normal. She exhibits no distension. There is no tenderness.  Musculoskeletal:  R arm dressed with curlex and gauze.  Unwrapped to reveal draining cutaneous abscess, with minimal surrounding cellulitis.    Neurological: She is alert.  She is oriented to self only.  She thinks she is at [facility] nursing home and that she just came from her daughters house.   Skin:  PICC in L arm, no surrounding erythema or tenderness.           Assessment & Plan:

## 2013-03-05 NOTE — Assessment & Plan Note (Signed)
Generalized weakness, limit any medications that may contribute.  Check vitamin D.

## 2013-03-05 NOTE — Assessment & Plan Note (Signed)
On aggrenox, no residuals from previous tia/stroke.

## 2013-03-07 NOTE — Discharge Summary (Signed)
This an addendum to D/C summary done by me on 03/02/2013  Severe Malnutrition: per RD note, maximize the feeding in the SNF, recommend Ensure Complete, one can BID.  Clint Lipps Pager: [phone] 03/07/2013, 7:38 PM

## 2013-03-11 ENCOUNTER — Non-Acute Institutional Stay: Payer: Medicare Other | Admitting: Family Medicine

## 2013-03-11 DIAGNOSIS — S0093XA Contusion of unspecified part of head, initial encounter: Secondary | ICD-10-CM | POA: Insufficient documentation

## 2013-03-11 DIAGNOSIS — S0003XA Contusion of scalp, initial encounter: Secondary | ICD-10-CM

## 2013-03-11 DIAGNOSIS — S0083XA Contusion of other part of head, initial encounter: Secondary | ICD-10-CM

## 2013-03-11 DIAGNOSIS — R269 Unspecified abnormalities of gait and mobility: Secondary | ICD-10-CM

## 2013-03-11 NOTE — Assessment & Plan Note (Signed)
No signs of intracranial injury

## 2013-03-11 NOTE — Progress Notes (Signed)
  Subjective:    Patient ID: Wanda Logan, female    DOB: [birth year], 79 y.o.   MRN: [id number]  HPI On visiting [facility] this morning, I found a notification that she had fallen 03/09/13 and was followed with neuro checks. Dr Wanda Logan was on-call that day, but didn't receive a call regarding the fall.   Wanda Logan says that she was in her bathroom and leaned forward too far in the wheelchair, got "overbalanced" and hit her face on the vanity. The staff note corroborates that story. No loss of consciousness noted. She denies headache, but says that her face is tender to touch. No vision problems or nausea reported.    Review of Systems     Objective:   Physical Exam  Eyes: Conjunctivae are normal. Pupils are equal, round, and reactive to light. Right eye exhibits no discharge. Left eye exhibits discharge.  Musculoskeletal:  range of motion normal in shoulders.  Neurological: She is alert. No cranial nerve deficit.  Not oriented, as before Stands using her arms with me spotting her.  Bears weight without pain, but Romberg unsteady.  Normal sensation beneath her eyes  Skin:  Hematoma of left brow with ecchymosis surrounding eyes, greater on left   Psychiatric: She has a normal mood and affect. Her behavior is normal.          Assessment & Plan:

## 2013-03-14 ENCOUNTER — Encounter: Payer: Self-pay | Admitting: Family Medicine

## 2013-03-14 DIAGNOSIS — E559 Vitamin D deficiency, unspecified: Secondary | ICD-10-CM | POA: Insufficient documentation

## 2013-03-15 ENCOUNTER — Other Ambulatory Visit: Payer: Self-pay | Admitting: Family Medicine

## 2013-03-15 MED ORDER — VITAMIN D (ERGOCALCIFEROL) 1.25 MG (50000 UNIT) PO CAPS: 50000.0000 [IU] | ORAL_CAPSULE | ORAL | Status: DC

## 2013-03-28 ENCOUNTER — Other Ambulatory Visit: Payer: Self-pay | Admitting: Family Medicine

## 2013-04-03 ENCOUNTER — Encounter: Payer: Self-pay | Admitting: Family Medicine

## 2013-04-03 ENCOUNTER — Non-Acute Institutional Stay: Payer: Medicare Other | Admitting: Family Medicine

## 2013-04-03 DIAGNOSIS — E559 Vitamin D deficiency, unspecified: Secondary | ICD-10-CM

## 2013-04-03 DIAGNOSIS — IMO0002 Reserved for concepts with insufficient information to code with codable children: Secondary | ICD-10-CM

## 2013-04-03 DIAGNOSIS — L03113 Cellulitis of right upper limb: Secondary | ICD-10-CM

## 2013-04-03 DIAGNOSIS — Z593 Problems related to living in residential institution: Secondary | ICD-10-CM | POA: Insufficient documentation

## 2013-04-03 DIAGNOSIS — R296 Repeated falls: Secondary | ICD-10-CM

## 2013-04-03 DIAGNOSIS — S0093XS Contusion of unspecified part of head, sequela: Secondary | ICD-10-CM

## 2013-04-03 DIAGNOSIS — Z9181 History of falling: Secondary | ICD-10-CM

## 2013-04-03 NOTE — Assessment & Plan Note (Addendum)
Forehead hematoma has resolved. Now with small contusion to L occipital area following most recent fall.

## 2013-04-03 NOTE — Assessment & Plan Note (Signed)
Resolved with IV vancomycin.

## 2013-04-03 NOTE — Progress Notes (Signed)
Subjective:     Patient ID: Wanda Logan, female   DOB: 10-18-1924, 79 y.o.   MRN: [id number]  HPI 79 yo F nursing home resident. Seen in for routine monthly visit.  1. R elbow cellulitis: patient completed IV vancomycin on 03/15/2013. She denies pain or swelling in her elbow. She has also participated in physical therapy with therapy goals met.   2. Recurrent falls: had two falls with head trauma. No loss of consciousness, no chronic headache or neurological deficits. Her last fall was one week ago. She ambulates via wheelchairs. She has dementia with MMSE of 18.   3. Vitamin D deficiency: patient compliant with oral iron.   4. Diarrhea: reported per patient but no documented by nursing staff. No associated fever or abdominal pain. Associated with some anorexia. Patient reports that her oral intake is fair.    Review of Systems As per HPI     Objective:   Physical Exam BP 148/74  Pulse 60  Temp(Src) 97.5 F (36.4 C)  Resp 18  Wt 131 lb 6.4 oz (59.603 kg)  BMI 22.54 kg/m2 Wt Readings from Last 3 Encounters:  04/03/13 131 lb 6.4 oz (59.603 kg)  03/01/13 141 lb 5 oz (64.099 kg)  11/17/12 160 lb (72.576 kg)   General appearance: alert, cooperative and no distress Head: Normocephalic, without obvious abnormality, scalp contusion occipital, no tenderness  Lungs: clear to auscultation bilaterally Heart: regular rate and rhythm, S1, S2 normal, no murmur, click, rub or gallop Abdomen: soft. Non tender.  Extremities:  R elbow:  Full ROM in elbow and shoulder. No effusion. No tenderness.     Assessment and Plan:

## 2013-04-03 NOTE — Assessment & Plan Note (Signed)
Replacing with oral vitamin D.

## 2013-04-03 NOTE — Assessment & Plan Note (Addendum)
Falls related to dementia/poor judgment.  Patient continued on Aricept.

## 2013-04-05 ENCOUNTER — Non-Acute Institutional Stay (INDEPENDENT_AMBULATORY_CARE_PROVIDER_SITE_OTHER): Payer: Medicare Other | Admitting: Family Medicine

## 2013-04-05 ENCOUNTER — Non-Acute Institutional Stay: Payer: Medicare Other | Admitting: Family Medicine

## 2013-04-05 ENCOUNTER — Other Ambulatory Visit: Payer: Self-pay | Admitting: Family Medicine

## 2013-04-05 DIAGNOSIS — M1990 Unspecified osteoarthritis, unspecified site: Secondary | ICD-10-CM

## 2013-04-05 DIAGNOSIS — R296 Repeated falls: Secondary | ICD-10-CM

## 2013-04-05 DIAGNOSIS — F0390 Unspecified dementia without behavioral disturbance: Secondary | ICD-10-CM

## 2013-04-05 DIAGNOSIS — Z5189 Encounter for other specified aftercare: Secondary | ICD-10-CM

## 2013-04-05 DIAGNOSIS — M539 Dorsopathy, unspecified: Secondary | ICD-10-CM

## 2013-04-05 DIAGNOSIS — I10 Essential (primary) hypertension: Secondary | ICD-10-CM

## 2013-04-05 DIAGNOSIS — Z9181 History of falling: Secondary | ICD-10-CM

## 2013-04-05 DIAGNOSIS — S0093XD Contusion of unspecified part of head, subsequent encounter: Secondary | ICD-10-CM

## 2013-04-05 DIAGNOSIS — G459 Transient cerebral ischemic attack, unspecified: Secondary | ICD-10-CM

## 2013-04-05 DIAGNOSIS — E876 Hypokalemia: Secondary | ICD-10-CM

## 2013-04-05 DIAGNOSIS — E559 Vitamin D deficiency, unspecified: Secondary | ICD-10-CM

## 2013-04-05 MED ORDER — CELECOXIB 200 MG PO CAPS: 200.0000 mg | ORAL_CAPSULE | Freq: Every day | ORAL | Status: DC

## 2013-04-05 MED ORDER — DONEPEZIL HCL 10 MG PO TABS: 10.0000 mg | ORAL_TABLET | Freq: Every evening | ORAL | Status: AC | PRN

## 2013-04-05 MED ORDER — ASPIRIN-DIPYRIDAMOLE ER 25-200 MG PO CP12: 1.0000 | ORAL_CAPSULE | Freq: Two times a day (BID) | ORAL | Status: DC

## 2013-04-05 MED ORDER — TRAMADOL HCL 50 MG PO TABS: 50.0000 mg | ORAL_TABLET | Freq: Four times a day (QID) | ORAL | Status: DC | PRN

## 2013-04-05 MED ORDER — VITAMIN D (ERGOCALCIFEROL) 1.25 MG (50000 UNIT) PO CAPS: 50000.0000 [IU] | ORAL_CAPSULE | ORAL | Status: DC

## 2013-04-05 MED ORDER — ATENOLOL 50 MG PO TABS: 50.0000 mg | ORAL_TABLET | Freq: Every day | ORAL | Status: AC

## 2013-04-05 NOTE — Progress Notes (Signed)
Family Medicine [facility] Discharge Summary  Patient name: Wanda Logan Medical record number: [id number] Date of birth: 10/05/1924 Age: 79 y.o. Gender: female Date of Admission: 03/04/13 Date of Discharge: 04/06/13 Admitting Physician: Dr. Zachery Dauer  Primary Care Provider: Emeterio Reeve, MD Consultants: Physical Therapy  Indication for Rehabilitation: Patient initially admitted to hospital with right elbow cellulitis requiring vancomycin treatment. Patient also experiencing frequent falls thought to be secondary to dementia and generalized weakness. PT/OT recommended continued PT/OT at SNF.   Discharge Diagnoses/Problem List:   1. Cellulitis of right upper extremity:  2. Frequent Falls 3. Vitamin D deficiency 4. Traumatic hematoma of the head 5. Dementia: 6. Low back pain from previous compression fractures 7. TIA  8. Hypertension  Disposition:  Home with home health to evaluate and treat  Discharge Condition: improved  Brief Hospital Course:   1. Cellulitis of right upper extremity: continued on IV vanc stopped on 03/15/13, with complete resolution of cellulitis.  2. Frequent Falls: thought to be from dementia and poor judgment. Continued on aricept.  3. Vitamin D deficiency: vitamin D level of 23 in June 2014. Started on vitamin D3 50 000 units every week for 8 weeks. First dose started 03/20/13.  4. Traumatic hematoma of the head: from fall on 03/09/13 where patient was i bathroom and leaned forward too far in the wheelchair, loosing balance and hitting the face on vanity. No loss of consciousness, no headache. There were no focal deficits with normal neuro checks and no neuroimaging was indicated at the time.  On discharge, forehead hematoma had resolved. Remained a small contusion to L occipital area following most recent fall.  5. Dementia: MMSE at [facility] of 18. Continued on aricept.  6. Low back pain from previous compression fractures: continued on  celebrex and tramadol  7. TIA: continued on aggrenox 8. Hypertension: well controled on atenolol 50 qd. BP on discharge: 128/72 with heart rate of 60. 9. Chronic diarrhea: patient complaining of chronic diarrhea. No reported diarrhea by nursing staff and c-diff was therefore not obtained.  10. Hypokalemia: likely from chronic diarrhea since patient not on diuretic. Given one dose of potassium chloride prior to discharge.   Issues for Follow Up:  - thrombocytopenia: platelet count on 04/03/13 of 117 from 305 in June 2014. No evidence of bleeding. Aggrenox continued for now with close follow up with PCP - hypokalemia: consider follow up BMP - low back pain: from compression fractures. Consider trial bisphonates if indicated - diarrhea: could be from aricept. If continues to be a problem, consider decreasing aricept and adding namenda as adjunctive therapy.  Significant Procedures: none  Significant Labs and Imaging:  04/03/13 WBC: 5.8, Hg: 11.2, Hct: 33.2, Platelet: 117 Na: 143, K: 3.1, Chl: 106, HCO3: 26, BUN: 26, Cr: 0.93, glucose: 86, Calcium: 9.2.   Outstanding Results:  none  Discharge Medications:    Medication List       This list is accurate as of: 04/05/13  3:26 PM.  Always use your most recent med list.               acetaminophen 325 MG tablet  Commonly known as:  TYLENOL  Take 650 mg by mouth daily as needed for pain.     atenolol 50 MG tablet  Commonly known as:  TENORMIN  Take 1 tablet (50 mg total) by mouth daily.     celecoxib 200 MG capsule  Commonly known as:  CELEBREX  Take 1 capsule (200 mg  total) by mouth daily.     dipyridamole-aspirin 200-25 MG per 12 hr capsule  Commonly known as:  AGGRENOX  Take 1 capsule by mouth 2 (two) times daily.     donepezil 10 MG tablet  Commonly known as:  ARICEPT  Take 1 tablet (10 mg total) by mouth at bedtime as needed.     feeding supplement Liqd  Take 237 mLs by mouth 2 (two) times daily between meals.      traMADol 50 MG tablet  Commonly known as:  ULTRAM  Take 1 tablet (50 mg total) by mouth every 6 (six) hours as needed for pain.     Vitamin D (Ergocalciferol) 50000 UNITS Caps  Commonly known as:  DRISDOL  Take 1 capsule (50,000 Units total) by mouth every 7 (seven) days. For 8 weeks. Start date: 03/20/13        Discharge Instructions:  Patient was counseled important signs and symptoms that should prompt return to medical care, changes in medications, dietary instructions, activity restrictions, and follow up appointments.    Lonia Skinner, MD 04/05/2013, 3:26 PM PGY-3, [facility]

## 2013-04-08 DIAGNOSIS — E876 Hypokalemia: Secondary | ICD-10-CM | POA: Insufficient documentation

## 2013-04-08 NOTE — Assessment & Plan Note (Signed)
For component related to osteoporosis, consider adding bisphosphonate.

## 2013-04-08 NOTE — Assessment & Plan Note (Signed)
well controlled  

## 2013-04-08 NOTE — Assessment & Plan Note (Signed)
I recommended they discuss with Dr Paulino Rily, trying a half dose of Aricept to see its effect on diarrhea.

## 2013-04-08 NOTE — Progress Notes (Signed)
  Subjective:    Patient ID: Wanda Logan, female    DOB: Sep 12, 1925, 79 y.o.   MRN: [id number]  HPI Diarrhea - continues daily. Her visiting grand daughter says that it predates the recent hospitalization, and they wonder if it is from the Aricept. Her PCP, Dr Wanda Logan, reportedly doubts this since she's been on it so long. She's had chronic back pain since her compression fracture last winter. Doesn't take a bisphosphonate.    Review of Systems     Objective:   Physical Exam  Constitutional: She appears well-developed and well-nourished.  Lying in bed, pleasantly interactive as usual.   Cardiovascular: Normal rate and regular rhythm.   No murmur heard. Pulmonary/Chest: Effort normal.  Abdominal: Soft. Bowel sounds are normal. She exhibits no distension. There is no tenderness.  Psychiatric: She has a normal mood and affect.          Assessment & Plan:  Stable to return home with close family follow up

## 2013-04-08 NOTE — Assessment & Plan Note (Signed)
Consider adding Namenda

## 2013-05-10 ENCOUNTER — Other Ambulatory Visit: Payer: Self-pay | Admitting: Family Medicine

## 2013-05-13 ENCOUNTER — Other Ambulatory Visit: Payer: Self-pay

## 2013-05-13 ENCOUNTER — Other Ambulatory Visit (HOSPITAL_COMMUNITY): Payer: Self-pay

## 2013-05-13 ENCOUNTER — Encounter (HOSPITAL_COMMUNITY): Payer: Self-pay | Admitting: Nurse Practitioner

## 2013-05-13 ENCOUNTER — Ambulatory Visit (HOSPITAL_COMMUNITY)
Admission: RE | Admit: 2013-05-13 | Discharge: 2013-05-13 | Disposition: A | Discharge status: Home / Self Care | Payer: Medicare Other | Source: Ambulatory Visit

## 2013-05-13 ENCOUNTER — Observation Stay (HOSPITAL_COMMUNITY)
Admission: EM | Admit: 2013-05-13 | Discharge: 2013-05-14 | Disposition: A | Discharge status: Home / Self Care | Payer: Medicare Other | Attending: Internal Medicine | Admitting: Internal Medicine

## 2013-05-13 DIAGNOSIS — Z9181 History of falling: Secondary | ICD-10-CM | POA: Insufficient documentation

## 2013-05-13 DIAGNOSIS — M539 Dorsopathy, unspecified: Secondary | ICD-10-CM

## 2013-05-13 DIAGNOSIS — Z79899 Other long term (current) drug therapy: Secondary | ICD-10-CM | POA: Insufficient documentation

## 2013-05-13 DIAGNOSIS — R600 Localized edema: Secondary | ICD-10-CM

## 2013-05-13 DIAGNOSIS — M7989 Other specified soft tissue disorders: Secondary | ICD-10-CM

## 2013-05-13 DIAGNOSIS — R296 Repeated falls: Secondary | ICD-10-CM

## 2013-05-13 DIAGNOSIS — K279 Peptic ulcer, site unspecified, unspecified as acute or chronic, without hemorrhage or perforation: Secondary | ICD-10-CM

## 2013-05-13 DIAGNOSIS — Z593 Problems related to living in residential institution: Secondary | ICD-10-CM

## 2013-05-13 DIAGNOSIS — M1990 Unspecified osteoarthritis, unspecified site: Secondary | ICD-10-CM

## 2013-05-13 DIAGNOSIS — E559 Vitamin D deficiency, unspecified: Secondary | ICD-10-CM

## 2013-05-13 DIAGNOSIS — M79605 Pain in left leg: Secondary | ICD-10-CM

## 2013-05-13 DIAGNOSIS — E876 Hypokalemia: Secondary | ICD-10-CM

## 2013-05-13 DIAGNOSIS — G459 Transient cerebral ischemic attack, unspecified: Secondary | ICD-10-CM

## 2013-05-13 DIAGNOSIS — R269 Unspecified abnormalities of gait and mobility: Secondary | ICD-10-CM

## 2013-05-13 DIAGNOSIS — I82409 Acute embolism and thrombosis of unspecified deep veins of unspecified lower extremity: Principal | ICD-10-CM | POA: Insufficient documentation

## 2013-05-13 DIAGNOSIS — I82402 Acute embolism and thrombosis of unspecified deep veins of left lower extremity: Secondary | ICD-10-CM

## 2013-05-13 DIAGNOSIS — Z7901 Long term (current) use of anticoagulants: Secondary | ICD-10-CM | POA: Insufficient documentation

## 2013-05-13 DIAGNOSIS — I10 Essential (primary) hypertension: Secondary | ICD-10-CM | POA: Insufficient documentation

## 2013-05-13 DIAGNOSIS — F0390 Unspecified dementia without behavioral disturbance: Secondary | ICD-10-CM | POA: Diagnosis present

## 2013-05-13 DIAGNOSIS — E7800 Pure hypercholesterolemia, unspecified: Secondary | ICD-10-CM | POA: Insufficient documentation

## 2013-05-13 DIAGNOSIS — R490 Dysphonia: Secondary | ICD-10-CM

## 2013-05-13 LAB — CBC WITH DIFFERENTIAL/PLATELET
Basophils Absolute: 0 10*3/uL (ref 0.0–0.1)
Eosinophils Relative: 2 % (ref 0–5)
HCT: 33.7 % — ABNORMAL LOW (ref 36.0–46.0)
Lymphocytes Relative: 31 % (ref 12–46)
MCV: 90.8 fL (ref 78.0–100.0)
Monocytes Absolute: 0.6 10*3/uL (ref 0.1–1.0)
RDW: 14.9 % (ref 11.5–15.5)
WBC: 5.2 10*3/uL (ref 4.0–10.5)

## 2013-05-13 LAB — COMPREHENSIVE METABOLIC PANEL
AST: 18 U/L (ref 0–37)
CO2: 27 mEq/L (ref 19–32)
Calcium: 9.8 mg/dL (ref 8.4–10.5)
Creatinine, Ser: 0.93 mg/dL (ref 0.50–1.10)
GFR calc Af Amer: 63 mL/min — ABNORMAL LOW (ref >90)
GFR calc non Af Amer: 54 mL/min — ABNORMAL LOW (ref >90)
Glucose, Bld: 90 mg/dL (ref 70–99)

## 2013-05-13 MED ORDER — LISINOPRIL 2.5 MG PO TABS
2.5000 mg | ORAL_TABLET | Freq: Every day | ORAL | Status: DC
  Administered 2013-05-14: 2.5 mg via ORAL
  Filled 2013-05-13: qty 1

## 2013-05-13 MED ORDER — ONDANSETRON HCL 4 MG/2ML IJ SOLN: 4.0000 mg | Freq: Four times a day (QID) | INTRAMUSCULAR | Status: DC | PRN

## 2013-05-13 MED ORDER — RIVAROXABAN 20 MG PO TABS: 20.0000 mg | ORAL_TABLET | Freq: Every day | ORAL | Status: DC

## 2013-05-13 MED ORDER — ONDANSETRON HCL 4 MG PO TABS: 4.0000 mg | ORAL_TABLET | Freq: Four times a day (QID) | ORAL | Status: DC | PRN

## 2013-05-13 MED ORDER — ATENOLOL 50 MG PO TABS
50.0000 mg | ORAL_TABLET | Freq: Every day | ORAL | Status: DC
  Administered 2013-05-14: 50 mg via ORAL
  Filled 2013-05-13: qty 1

## 2013-05-13 MED ORDER — RIVAROXABAN 15 MG PO TABS
15.0000 mg | ORAL_TABLET | Freq: Two times a day (BID) | ORAL | Status: DC
  Filled 2013-05-13: qty 1

## 2013-05-13 MED ORDER — DONEPEZIL HCL 10 MG PO TABS
10.0000 mg | ORAL_TABLET | Freq: Every day | ORAL | Status: DC
  Administered 2013-05-13: 10 mg via ORAL
  Filled 2013-05-13 (×2): qty 1

## 2013-05-13 MED ORDER — ENSURE COMPLETE PO LIQD: 237.0000 mL | Freq: Two times a day (BID) | ORAL | Status: DC

## 2013-05-13 MED ORDER — ACETAMINOPHEN 325 MG PO TABS: 650.0000 mg | ORAL_TABLET | Freq: Every day | ORAL | Status: DC | PRN

## 2013-05-13 MED ORDER — SODIUM CHLORIDE 0.9 % IJ SOLN
3.0000 mL | Freq: Two times a day (BID) | INTRAMUSCULAR | Status: DC
  Administered 2013-05-13: 3 mL via INTRAVENOUS

## 2013-05-13 MED ORDER — RIVAROXABAN 15 MG PO TABS
15.0000 mg | ORAL_TABLET | Freq: Two times a day (BID) | ORAL | Status: DC
  Administered 2013-05-13 – 2013-05-14 (×2): 15 mg via ORAL
  Filled 2013-05-13 (×5): qty 1

## 2013-05-13 NOTE — Progress Notes (Signed)
VASCULAR LAB PRELIMINARY  PRELIMINARY  PRELIMINARY  PRELIMINARY  Left lower extremity venous Doppler completed.    Preliminary report:  There is acute, occlusive DVT noted in the left popliteal, peroneal and posterior tibial veins.    Ily Denno, RVT 05/13/2013, 4:42 PM

## 2013-05-13 NOTE — ED Notes (Signed)
PCP ordered a doppler study for pt today because of L leg swelling and redness onset this am. Doppler study was + for DVT, pt was brought to ed for treatment of the DVT. Denies pain.

## 2013-05-13 NOTE — H&P (Signed)
[facility] History and Physical  Wanda Logan ZOX:[id number] DOB: [birth year]    PCP:   Emeterio Reeve, MD   Chief Complaint: sent from PCP to ER for OBS admission with newly Dx left leg DVT.  HPI: Wanda Logan is an 79 y.o. female with hx of dementia, HTN, TIA, frequent falls, recent upper ext infection, presents to her PCP accompanied by her daughter as her left leg has increase pain and swelling.  She doesn't have any shortness of breath, chest pain, palpitation, or any other complaints.  She was evaluated with a leg [place], which resulted in an acute DVT.  Her PCP recommended that she be admitted for OBS, and to start anticoagulation with Xarelto.  Patient has no other complaints.  Her laboratory study was unremarkable with platelet count slightly low at 129K.    Rewiew of Systems:  Constitutional: Negative for malaise, fever and chills. No significant weight loss or weight gain Eyes: Negative for eye pain, redness and discharge, diplopia, visual changes, or flashes of light. ENMT: Negative for ear pain, hoarseness, nasal congestion, sinus pressure and sore throat. No headaches; tinnitus, drooling, or problem swallowing. Cardiovascular: Negative for chest pain, palpitations, diaphoresis, dyspnea and peripheral edema. ; No orthopnea, PND Respiratory: Negative for cough, hemoptysis, wheezing and stridor. No pleuritic chestpain. Gastrointestinal: Negative for nausea, vomiting, diarrhea, constipation, abdominal pain, melena, blood in stool, hematemesis, jaundice and rectal bleeding.    Genitourinary: Negative for frequency, dysuria, incontinence,flank pain and hematuria; Musculoskeletal: Negative for back pain and neck pain. Negative for swelling and trauma.;  Skin: . Negative for pruritus, rash, abrasions, bruising and skin lesion.; ulcerations Neuro: Negative for headache, lightheadedness and neck stiffness. Negative for weakness, altered level of consciousness , altered mental  status, extremity weakness, burning feet, involuntary movement, seizure and syncope.  Psych: negative for anxiety, depression, insomnia, tearfulness, panic attacks, hallucinations, paranoia, suicidal or homicidal ideation    Past Medical History  Diagnosis Date  . Hypertension   . TIA (transient ischemic attack)   . Peptic ulcer disease   . UTI (lower urinary tract infection)   . Back problem   . High blood cholesterol   . Dysphonia   . Dementia   . Falls frequently   . Cellulitis 02/25/2013    right arm  . Cellulitis of right upper extremity 02/25/2013    Past Surgical History  Procedure Laterality Date  . Cholecystectomy      Medications:  HOME MEDS: Prior to Admission medications   Medication Sig Start Date End Date Taking? Authorizing Provider  acetaminophen (TYLENOL) 325 MG tablet Take 650 mg by mouth daily as needed for pain.   Yes Historical Provider, MD  celecoxib (CELEBREX) 200 MG capsule Take 200 mg by mouth as needed for pain.   Yes Historical Provider, MD  dipyridamole-aspirin (AGGRENOX) 200-25 MG per 12 hr capsule Take 1 capsule by mouth 2 (two) times daily. 04/05/13  Yes Lonia Skinner, MD  donepezil (ARICEPT) 10 MG tablet Take 1 tablet (10 mg total) by mouth at bedtime as needed. 04/05/13  Yes Lonia Skinner, MD  feeding supplement (ENSURE COMPLETE) LIQD Take 237 mLs by mouth 2 (two) times daily between meals. 02/27/13  Yes Penny Pia, MD  lisinopril (PRINIVIL,ZESTRIL) 2.5 MG tablet Take 2.5 mg by mouth daily.   Yes Historical Provider, MD  traMADol (ULTRAM) 50 MG tablet Take 1 tablet (50 mg total) by mouth every 6 (six) hours as needed for pain. 04/05/13  Yes Lonia Skinner, MD  Vitamin D, Ergocalciferol, (DRISDOL) 50000 UNITS CAPS Take 1 capsule (50,000 Units total) by mouth every 7 (seven) days. For 8 weeks. Start date: 03/20/13 04/05/13  Yes Lonia Skinner, MD  atenolol (TENORMIN) 50 MG tablet Take 1 tablet (50 mg total) by mouth daily. 04/05/13   Lonia Skinner, MD     Allergies:  Allergies  Allergen Reactions  . Aspirin     REACTION: ULCER  . Atorvastatin Other (See Comments)    unknown  . Penicillins Other (See Comments)    unknown    Social History:   reports that she has never smoked. She has never used smokeless tobacco. She reports that she does not drink alcohol or use illicit drugs.  Family History: Family History  Problem Relation Age of Onset  . Stroke Father   . Stroke Mother   . Aneurysm Brother   . Coronary artery disease Sister   . Heart attack Sister   . Stroke Sister   . Stroke Sister      Physical Exam: Filed Vitals:   05/13/13 1705 05/13/13 1957 05/13/13 2028  BP: 142/97 164/86 177/81  Pulse: 100 71 65  Temp: 98.1 F (36.7 C)  97.3 F (36.3 C)  TempSrc: Oral  Oral  Resp: 16 18   Height: 5\' 3"  (1.6 m)  5\' 5"  (1.651 m)  Weight: 57.607 kg (127 lb)  59.3 kg (130 lb 11.7 oz)  SpO2: 96% 98% 100%   Blood pressure 177/81, pulse 65, temperature 97.3 F (36.3 C), temperature source Oral, resp. rate 18, height 5\' 5"  (1.651 m), weight 59.3 kg (130 lb 11.7 oz), SpO2 100.00%.  GEN:  Pleasant  patient lying in the stretcher in no acute distress; cooperative with exam. PSYCH:  alert and oriented x4; does not appear anxious or depressed; affect is appropriate. HEENT: Mucous membranes pink and anicteric; PERRLA; EOM intact; no cervical lymphadenopathy nor thyromegaly or carotid bruit; no JVD; There were no stridor. Neck is very supple. Breasts:: Not examined CHEST WALL: No tenderness CHEST: Normal respiration, clear to auscultation bilaterally.  HEART: Regular rate and rhythm.  There are no murmur, rub, or gallops.   BACK: No kyphosis or scoliosis; no CVA tenderness ABDOMEN: soft and non-tender; no masses, no organomegaly, normal abdominal bowel sounds; no pannus; no intertriginous candida. There is no rebound and no distention. Rectal Exam: Not done EXTREMITIES: No bone or joint deformity; age-appropriate  arthropathy of the hands and knees; slight left leg edema. no ulcerations.  There is no calf tenderness. Genitalia: not examined PULSES: 2+ and symmetric SKIN: Normal hydration no rash or ulceration CNS: Cranial nerves 2-12 grossly intact no focal lateralizing neurologic deficit.  Speech is fluent; uvula elevated with phonation, facial symmetry and tongue midline. DTR are normal bilaterally, cerebella exam is intact, barbinski is negative and strengths are equaled bilaterally.  No sensory loss.   Labs on Admission:  Basic Metabolic Panel:  Recent Labs Lab 05/13/13 1752  NA 140  K 3.5  CL 104  CO2 27  GLUCOSE 90  BUN 18  CREATININE 0.93  CALCIUM 9.8   Liver Function Tests:  Recent Labs Lab 05/13/13 1752  AST 18  ALT 9  ALKPHOS 52  BILITOT 0.3  PROT 6.4  ALBUMIN 3.6   No results found for this basename: LIPASE, AMYLASE,  in the last 168 hours No results found for this basename: AMMONIA,  in the last 168 hours CBC:  Recent Labs Lab 05/13/13 1752  WBC 5.2  NEUTROABS  3.0  HGB 11.6*  HCT 33.7*  MCV 90.8  PLT 129*   Cardiac Enzymes: No results found for this basename: CKTOTAL, CKMB, CKMBINDEX, TROPONINI,  in the last 168 hours  CBG: No results found for this basename: GLUCAP,  in the last 168 hours   Radiological Exams on Admission: No results found.  Assessment/Plan Present on Admission:  . DVT (deep venous thrombosis) . Dementia . Abnormality of gait . Hypertension . High blood cholesterol  PLAN:  Will admit her under OBS as per her PCP.  Will start her on Xarelto for acute left leg DVT.  She is stable, full code, and will be admitted under [facility] service.  Her home meds were continued.  Thank you.  Other plans as per orders.  Code Status: FULL Unk Lightning, MD. [facility] Pager [phone] 7pm to 7am.  05/13/2013, 9:45 PM

## 2013-05-13 NOTE — ED Provider Notes (Signed)
CSN: [id number]     Arrival date & time 05/13/13  1651 History     First MD Initiated Contact with Patient 05/13/13 1719     Chief Complaint  Patient presents with  . DVT   (Consider location/radiation/quality/duration/timing/severity/associated sxs/prior Treatment) The history is provided by the patient and medical records.   63 show female with history of hypertension, TIA, hyperlipidemia presents at request of PCP with left lower extremity swelling and acute left lower semi-DVT diagnosed outpatient ultrasound.  Daughter reports that patient has home health approximately once a week and a caregiver that is present, and caregiver noticed that slowly over the last couple days with worsening today of left lower extremity swelling. Patient denies any pain associated with the area. Denies any redness, fevers, chills. Denies any chest pain, shortness of breath. Denies any history of previous DVT or PE. Denies any recent surgeries, estrogen use, and no personal history of cancer,  Family history of DVT.  Nose she has been less active, and was recently in the hospital however he denies immobilization overall.  Patient taking aggrenox for history of TIA.  Patient went to PCP this AM who ordered DVT study which was positive and called patient to recommend she proceed to the ED.  Daughter ntoes pt has history of falls.    Past Medical History  Diagnosis Date  . Hypertension   . TIA (transient ischemic attack)   . Peptic ulcer disease   . UTI (lower urinary tract infection)   . Back problem   . High blood cholesterol   . Dysphonia   . Dementia   . Falls frequently   . Cellulitis 02/25/2013    right arm  . Cellulitis of right upper extremity 02/25/2013   Past Surgical History  Procedure Laterality Date  . Cholecystectomy     Family History  Problem Relation Age of Onset  . Stroke Father   . Stroke Mother   . Aneurysm Brother   . Coronary artery disease Sister   . Heart attack Sister   .  Stroke Sister   . Stroke Sister    History  Substance Use Topics  . Smoking status: Never Smoker   . Smokeless tobacco: Never Used  . Alcohol Use: No   OB History   Grav Para Term Preterm Abortions TAB SAB Ect Mult Living                 Review of Systems  Constitutional: Negative for fever.  HENT: Negative for sore throat and neck pain.   Eyes: Negative for visual disturbance.  Respiratory: Negative for cough and shortness of breath.   Cardiovascular: Positive for leg swelling. Negative for chest pain.  Gastrointestinal: Negative for nausea, vomiting, abdominal pain and diarrhea.  Genitourinary: Negative for difficulty urinating.  Musculoskeletal: Negative for back pain.  Skin: Negative for rash.  Neurological: Negative for syncope and headaches.    Allergies  Aspirin; Atorvastatin; and Penicillins  Home Medications   No current outpatient prescriptions on file. BP 156/82  Pulse 65  Temp(Src) 97.3 F (36.3 C) (Oral)  Resp 18  Ht 5\' 5"  (1.651 m)  Wt 130 lb 11.7 oz (59.3 kg)  BMI 21.76 kg/m2  SpO2 100% Physical Exam  Nursing note and vitals reviewed. Constitutional: She is oriented to person, place, and time. She appears well-developed and well-nourished. No distress.  HENT:  Head: Normocephalic and atraumatic.  Eyes: Conjunctivae and EOM are normal.  Neck: Normal range of motion.  Cardiovascular: Normal rate,  regular rhythm, normal heart sounds and intact distal pulses.  Exam reveals no gallop and no friction rub.   No murmur heard. Bilateral LE pulses difficult to palpate with trace, doppler at bedside   Pulmonary/Chest: Effort normal and breath sounds normal. No respiratory distress. She has no wheezes. She has no rales.  Abdominal: Soft. She exhibits no distension. There is no tenderness. There is no guarding.  Musculoskeletal: She exhibits edema (LLE). She exhibits no tenderness.  Neurological: She is alert and oriented to person, place, and time.  Skin:  Skin is warm and dry. No rash noted. She is not diaphoretic. No erythema.    ED Course   Procedures (including critical care time)  Labs Reviewed  MRSA PCR SCREENING - Abnormal; Notable for the following:    MRSA by PCR POSITIVE (*)    All other components within normal limits  CBC WITH DIFFERENTIAL - Abnormal; Notable for the following:    RBC 3.71 (*)    Hemoglobin 11.6 (*)    HCT 33.7 (*)    Platelets 129 (*)    All other components within normal limits  COMPREHENSIVE METABOLIC PANEL - Abnormal; Notable for the following:    GFR calc non Af Amer 54 (*)    GFR calc Af Amer 63 (*)    All other components within normal limits  PROTIME-INR  APTT   No results found. 1. Dementia   2. Falls frequently   3. Hypertension   4. DVT (deep venous thrombosis), left     MDM  23 show female with history of hypertension, TIA, hyperlipidemia presents at request of PCP with left lower extremity swelling and acute left lower semi-DVT diagnosed outpatient ultrasound.  Patient without symptoms or signs to indicate PE. No signs of synovitis or infection on exam. CMP and CBC ordered to evaluate baseline kidney function and platelets, were significant for a platelet count of 129, and a GFR 54.  Given patient age, coexisting aggrenox use, request of PCP for patient to come to the hospital for evaluation patient will be admitted to Hospitalist Service for observation and initiation of anticoagulation.      Rhae Lerner, MD 05/14/13 [phone]

## 2013-05-13 NOTE — Progress Notes (Addendum)
ANTICOAGULATION CONSULT NOTE - Initial Consult  Pharmacy Consult for Xarelto Indication: DVT  Allergies  Allergen Reactions  . Aspirin     REACTION: ULCER  . Atorvastatin Other (See Comments)    unknown  . Penicillins Other (See Comments)    unknown    Patient Measurements: Height: 5\' 5"  (165.1 cm) Weight: 130 lb 11.7 oz (59.3 kg) IBW/kg (Calculated) : 57 Heparin Dosing Weight: n/a  Vital Signs: Temp: 97.3 F (36.3 C) (08/18 2028) Temp src: Oral (08/18 2028) BP: 177/81 mmHg (08/18 2028) Pulse Rate: 65 (08/18 2028)  Labs:  Recent Labs  05/13/13 1752  HGB 11.6*  HCT 33.7*  PLT 129*  APTT 30  LABPROT 13.3  INR 1.03  CREATININE 0.93    Estimated Creatinine Clearance: 39.1 ml/min (by C-G formula based on Cr of 0.93).   Medical History: Past Medical History  Diagnosis Date  . Hypertension   . TIA (transient ischemic attack)   . Peptic ulcer disease   . UTI (lower urinary tract infection)   . Back problem   . High blood cholesterol   . Dysphonia   . Dementia   . Falls frequently   . Cellulitis 02/25/2013    right arm  . Cellulitis of right upper extremity 02/25/2013    Medications:  Scheduled:  . [START ON 05/14/2013] atenolol  50 mg Oral Daily  . donepezil  10 mg Oral QHS  . [START ON 05/14/2013] feeding supplement  237 mL Oral BID BM  . [START ON 05/14/2013] lisinopril  2.5 mg Oral Daily  . sodium chloride  3 mL Intravenous Q12H    Assessment: 79 yo female admitted with new L leg DVT.  Pharmacy asked to begin anticoagulation with Xarelto.  Estimated CrCl ~ 39 ml/min.  Per notes, history of frequent falls.  Goal of Therapy:  Therapeutic Anticoagulation Monitor platelets by anticoagulation protocol: Yes   Plan:  1. Xarelto 15 mg BID x 21 days, then 20 mg once daily with food. 2. Will need to watch renal function carefully, if it worsens, may need to consider alternative anticoagulant. 3. Monitor for sign/symptoms of bleeding.  Tad Moore, BCPS  Clinical Pharmacist Pager [phone]  05/13/2013 10:09 PM

## 2013-05-14 DIAGNOSIS — R269 Unspecified abnormalities of gait and mobility: Secondary | ICD-10-CM

## 2013-05-14 MED ORDER — CHLORHEXIDINE GLUCONATE CLOTH 2 % EX PADS
6.0000 | MEDICATED_PAD | Freq: Every day | CUTANEOUS | Status: DC
  Administered 2013-05-14: 6 via TOPICAL

## 2013-05-14 MED ORDER — MUPIROCIN 2 % EX OINT
1.0000 "application " | TOPICAL_OINTMENT | Freq: Two times a day (BID) | CUTANEOUS | Status: DC
  Filled 2013-05-14: qty 22

## 2013-05-14 MED ORDER — RIVAROXABAN 15 MG PO TABS: 15.0000 mg | ORAL_TABLET | Freq: Two times a day (BID) | ORAL | Status: DC

## 2013-05-14 MED ORDER — RIVAROXABAN 20 MG PO TABS: 20.0000 mg | ORAL_TABLET | Freq: Every day | ORAL | Status: AC

## 2013-05-14 NOTE — Progress Notes (Signed)
IV d/c'd, tele d/c'd.  All discharge paperwork and prescriptions given to pt and reviewed with pt and family.  They deny further questions or concerns at this time.  Ardyth Gal, RN 05/14/2013

## 2013-05-14 NOTE — ED Provider Notes (Signed)
Medical screening examination/treatment/procedure(s) were conducted as a shared visit with resident physician and myself.  I personally evaluated the patient during the encounter.  I interviewed and examined the patient. She appears in no acute distress. Plan on admission for DVT.   Junius Argyle, MD 05/14/13 1141

## 2013-05-14 NOTE — Progress Notes (Signed)
The patient is A&Ox3 and does not have any complaints of pain this morning.  She remained on strict bed rest overnight.  Her lower extremities are warm to the touch and the Doppler was used to obtain pedal pulses.  Her capillary refill is less than three seconds on both lower extremities.

## 2013-05-14 NOTE — Discharge Summary (Signed)
Physician Discharge Summary  Wanda Logan ZOX:[id number] DOB: 1924-12-05 DOA: 05/13/2013  PCP: Emeterio Reeve, MD  Admit date: 05/13/2013 Discharge date: 05/14/2013  Time spent: 40 minutes  Recommendations for Outpatient Follow-up:  1. Follow up with primary MD.   Discharge Diagnoses:  Principal Problem:   DVT (deep venous thrombosis) Active Problems:   Hypertension   High blood cholesterol   Dementia   Falls frequently   Abnormality of gait   Person living in residential institution   Discharge Condition: Satisfactory.   Diet recommendation: Heart Healthy.   Filed Weights   05/13/13 1705 05/13/13 2028 05/14/13 0405  Weight: 57.607 kg (127 lb) 59.3 kg (130 lb 11.7 oz) 58.4 kg (128 lb 12 oz)    History of present illness:  79 y.o. female with hx of dementia, HTN, TIA, frequent falls, PUD, dyslipidemia, recent right arm infection, presents to her PCP, with increased pain and swelling of her left leg. She doesn't have any shortness of breath, chest pain, palpitation, or any other complaints. She was evaluated with a leg U/S, which resulted in an acute DVT. Her PCP recommended that she be admitted for OBS, and to start anticoagulation with Xarelto. Patient has no other complaints. Her laboratory study was unremarkable with platelet count slightly low at 129K. Admitted for further management.    Hospital Course:  1. LLE DVT: Patient presented with progressive LLE swelling and pain and venous doppler reportedly confirmed acute DVT. Commenced on Xarelto, and as of AM of 05/14/13, LLE was soft, non-tender, and patient was pain-free. Aggrenox and Celebrx have been discontinued.  2. HTN: Continued on pre-admission antihypertensives.  3. Dyslipidemia: Not on lipid-lowering medication, pre-admission. Defer to PMD.  4. Gait instability: Known history of recurrent falls, deemed secondary to generalized weakness and dementia. No recent fall.  5. Dementia: Stable on Aricept.     Procedures:  LLE venous Duplex 05/13/13.   Consultations:  N/A.   Discharge Exam: Filed Vitals:   05/14/13 0621  BP: 163/60  Pulse:   Temp:   Resp:     General: Comfortable, alert, communicative, not short of breath at rest.  HEENT: Mild clinical pallor, no jaundice, no conjunctival injection or discharge. Hydration is satisfactory.  NECK: Supple, JVP not seen, no carotid bruits, no palpable lymphadenopathy, no palpable goiter.  CHEST: Clinically clear to auscultation, no wheezes, no crackles.  HEART: Sounds 1 and 2 heard, normal, regular, no murmurs.  ABDOMEN: Full, soft, non-tender, no palpable organomegaly, no palpable masses, normal bowel sounds.  GENITALIA: Not examined.  LOWER EXTREMITIES: No pitting edema, palpable peripheral pulses. LLE is quite unremarkable, and calf is not tense or tender.  MUSCULOSKELETAL SYSTEM: Generalized osteoarthritic changes, otherwise, normal.  CENTRAL NERVOUS SYSTEM: No focal neurologic deficit on gross examination.  Discharge Instructions      Discharge Orders   Future Orders Complete By Expires   Diet - low sodium heart healthy  As directed    Increase activity slowly  As directed        Medication List    STOP taking these medications       celecoxib 200 MG capsule  Commonly known as:  CELEBREX     dipyridamole-aspirin 200-25 MG per 12 hr capsule  Commonly known as:  AGGRENOX      TAKE these medications       acetaminophen 325 MG tablet  Commonly known as:  TYLENOL  Take 650 mg by mouth daily as needed for pain.     atenolol 50  MG tablet  Commonly known as:  TENORMIN  Take 1 tablet (50 mg total) by mouth daily.     donepezil 10 MG tablet  Commonly known as:  ARICEPT  Take 1 tablet (10 mg total) by mouth at bedtime as needed.     feeding supplement Liqd  Take 237 mLs by mouth 2 (two) times daily between meals.     lisinopril 2.5 MG tablet  Commonly known as:  PRINIVIL,ZESTRIL  Take 2.5 mg by mouth daily.      Rivaroxaban 15 MG Tabs tablet  Commonly known as:  XARELTO  Take 1 tablet (15 mg total) by mouth 2 (two) times daily with a meal.     Rivaroxaban 20 MG Tabs tablet  Commonly known as:  XARELTO  Take 1 tablet (20 mg total) by mouth daily with supper.  Start taking on:  06/03/2013     traMADol 50 MG tablet  Commonly known as:  ULTRAM  Take 1 tablet (50 mg total) by mouth every 6 (six) hours as needed for pain.     Vitamin D (Ergocalciferol) 50000 UNITS Caps capsule  Commonly known as:  DRISDOL  Take 1 capsule (50,000 Units total) by mouth every 7 (seven) days. For 8 weeks. Start date: 03/20/13       Allergies  Allergen Reactions  . Aspirin     REACTION: ULCER  . Atorvastatin Other (See Comments)    unknown  . Penicillins Other (See Comments)    unknown   Follow-up Information   Call Emeterio Reeve, MD.   Specialty:  Family Medicine   Contact information:   [address] [place] [address] [phone]        The results of significant diagnostics from this hospitalization (including imaging, microbiology, ancillary and laboratory) are listed below for reference.    Significant Diagnostic Studies: No results found.  Microbiology: Recent Results (from the past 240 hour(s))  MRSA PCR SCREENING     Status: Abnormal   Collection Time    05/13/13  8:35 PM      Result Value Range Status   MRSA by PCR POSITIVE (*) NEGATIVE Final   Comment:            The GeneXpert MRSA Assay (FDA     approved for NASAL specimens     only), is one component of a     comprehensive MRSA colonization     surveillance program. It is not     intended to diagnose MRSA     infection nor to guide or     monitor treatment for     MRSA infections.     RESULT CALLED TO, READ BACK BY AND VERIFIED WITH:     RN J.HARAWAY AT 0103 05/14/13 BY L.PITT     Labs: Basic Metabolic Panel:  Recent Labs Lab 05/13/13 1752  NA 140  K 3.5  CL 104  CO2 27  GLUCOSE 90  BUN 18   CREATININE 0.93  CALCIUM 9.8   Liver Function Tests:  Recent Labs Lab 05/13/13 1752  AST 18  ALT 9  ALKPHOS 52  BILITOT 0.3  PROT 6.4  ALBUMIN 3.6   No results found for this basename: LIPASE, AMYLASE,  in the last 168 hours No results found for this basename: AMMONIA,  in the last 168 hours CBC:  Recent Labs Lab 05/13/13 1752  WBC 5.2  NEUTROABS 3.0  HGB 11.6*  HCT 33.7*  MCV 90.8  PLT 129*  Cardiac Enzymes: No results found for this basename: CKTOTAL, CKMB, CKMBINDEX, TROPONINI,  in the last 168 hours BNP: BNP (last 3 results) No results found for this basename: PROBNP,  in the last 8760 hours CBG: No results found for this basename: GLUCAP,  in the last 168 hours     Signed:  Zhi Geier,CHRISTOPHER  [facility] 05/14/2013, 8:59 AM

## 2013-05-14 NOTE — Progress Notes (Signed)
The patient arrived to 4E03 from the ED via a stretcher at 2030.  The patient was oriented to the unit and placed on telemetry.  She is A&Ox3.  VS were taken and she was assessed.  Her daughter was at the bedside at this time.  The call bell was explained and placed within reach and the bed alarm was turned on.  The importance of bedrest was emphasized and explained to both the patient and the daughter due to the LLE DVTs.  Admissions was notified about the patient's arrival to the floor.

## 2013-05-22 ENCOUNTER — Inpatient Hospital Stay (HOSPITAL_COMMUNITY): Payer: Medicare Other

## 2013-05-22 ENCOUNTER — Inpatient Hospital Stay (HOSPITAL_COMMUNITY)
Admission: EM | Admit: 2013-05-22 | Discharge: 2013-05-24 | DRG: 069 | Disposition: A | Discharge status: Home Health | Payer: Medicare Other | Attending: Internal Medicine | Admitting: Internal Medicine

## 2013-05-22 ENCOUNTER — Encounter (HOSPITAL_COMMUNITY): Payer: Self-pay | Admitting: Radiology

## 2013-05-22 ENCOUNTER — Emergency Department (HOSPITAL_COMMUNITY): Payer: Medicare Other

## 2013-05-22 DIAGNOSIS — I82409 Acute embolism and thrombosis of unspecified deep veins of unspecified lower extremity: Secondary | ICD-10-CM | POA: Diagnosis present

## 2013-05-22 DIAGNOSIS — F0390 Unspecified dementia without behavioral disturbance: Secondary | ICD-10-CM

## 2013-05-22 DIAGNOSIS — Z880 Allergy status to penicillin: Secondary | ICD-10-CM

## 2013-05-22 DIAGNOSIS — I10 Essential (primary) hypertension: Secondary | ICD-10-CM

## 2013-05-22 DIAGNOSIS — R269 Unspecified abnormalities of gait and mobility: Secondary | ICD-10-CM

## 2013-05-22 DIAGNOSIS — M1990 Unspecified osteoarthritis, unspecified site: Secondary | ICD-10-CM | POA: Diagnosis present

## 2013-05-22 DIAGNOSIS — E785 Hyperlipidemia, unspecified: Secondary | ICD-10-CM | POA: Diagnosis present

## 2013-05-22 DIAGNOSIS — I82402 Acute embolism and thrombosis of unspecified deep veins of left lower extremity: Secondary | ICD-10-CM

## 2013-05-22 DIAGNOSIS — R296 Repeated falls: Secondary | ICD-10-CM

## 2013-05-22 DIAGNOSIS — Z86718 Personal history of other venous thrombosis and embolism: Secondary | ICD-10-CM

## 2013-05-22 DIAGNOSIS — Z8711 Personal history of peptic ulcer disease: Secondary | ICD-10-CM

## 2013-05-22 DIAGNOSIS — K279 Peptic ulcer, site unspecified, unspecified as acute or chronic, without hemorrhage or perforation: Secondary | ICD-10-CM | POA: Diagnosis present

## 2013-05-22 DIAGNOSIS — R4181 Age-related cognitive decline: Secondary | ICD-10-CM | POA: Diagnosis present

## 2013-05-22 DIAGNOSIS — Z79899 Other long term (current) drug therapy: Secondary | ICD-10-CM

## 2013-05-22 DIAGNOSIS — Z9181 History of falling: Secondary | ICD-10-CM

## 2013-05-22 DIAGNOSIS — E7800 Pure hypercholesterolemia, unspecified: Secondary | ICD-10-CM

## 2013-05-22 DIAGNOSIS — G459 Transient cerebral ischemic attack, unspecified: Principal | ICD-10-CM

## 2013-05-22 LAB — POCT I-STAT, CHEM 8
BUN: 22 mg/dL (ref 6–23)
Calcium, Ion: 1.24 mmol/L (ref 1.13–1.30)
HCT: 39 % (ref 36.0–46.0)
Hemoglobin: 13.3 g/dL (ref 12.0–15.0)
Sodium: 137 mEq/L (ref 135–145)
TCO2: 26 mmol/L (ref 0–100)

## 2013-05-22 LAB — RAPID URINE DRUG SCREEN, HOSP PERFORMED
Amphetamines: NOT DETECTED
Cocaine: NOT DETECTED
Opiates: NOT DETECTED
Tetrahydrocannabinol: NOT DETECTED

## 2013-05-22 LAB — CBC
HCT: 37 % (ref 36.0–46.0)
Hemoglobin: 13.1 g/dL (ref 12.0–15.0)
MCH: 32.1 pg (ref 26.0–34.0)
MCHC: 35.4 g/dL (ref 30.0–36.0)
RDW: 14.5 % (ref 11.5–15.5)

## 2013-05-22 LAB — COMPREHENSIVE METABOLIC PANEL
AST: 20 U/L (ref 0–37)
Albumin: 4 g/dL (ref 3.5–5.2)
BUN: 21 mg/dL (ref 6–23)
Calcium: 10.1 mg/dL (ref 8.4–10.5)
Chloride: 97 mEq/L (ref 96–112)
Creatinine, Ser: 0.96 mg/dL (ref 0.50–1.10)
Total Bilirubin: 0.7 mg/dL (ref 0.3–1.2)
Total Protein: 7.1 g/dL (ref 6.0–8.3)

## 2013-05-22 LAB — URINALYSIS, ROUTINE W REFLEX MICROSCOPIC
Glucose, UA: NEGATIVE mg/dL
Hgb urine dipstick: NEGATIVE
Protein, ur: NEGATIVE mg/dL

## 2013-05-22 LAB — GLUCOSE, CAPILLARY: Glucose-Capillary: 105 mg/dL — ABNORMAL HIGH (ref 70–99)

## 2013-05-22 LAB — PROTIME-INR: Prothrombin Time: 19.6 seconds — ABNORMAL HIGH (ref 11.6–15.2)

## 2013-05-22 LAB — TROPONIN I: Troponin I: <0.3 ng/mL (ref <0.30)

## 2013-05-22 LAB — DIFFERENTIAL
Basophils Absolute: 0 10*3/uL (ref 0.0–0.1)
Basophils Relative: 0 % (ref 0–1)
Eosinophils Absolute: 0.1 10*3/uL (ref 0.0–0.7)
Eosinophils Relative: 1 % (ref 0–5)
Monocytes Absolute: 1 10*3/uL (ref 0.1–1.0)
Monocytes Relative: 13 % — ABNORMAL HIGH (ref 3–12)

## 2013-05-22 LAB — ETHANOL: Alcohol, Ethyl (B): <11 mg/dL (ref 0–11)

## 2013-05-22 LAB — POCT I-STAT TROPONIN I: Troponin i, poc: 0.01 ng/mL (ref 0.00–0.08)

## 2013-05-22 LAB — APTT: aPTT: 43 seconds — ABNORMAL HIGH (ref 24–37)

## 2013-05-22 MED ORDER — SODIUM CHLORIDE 0.9 % IJ SOLN: 3.0000 mL | Freq: Two times a day (BID) | INTRAMUSCULAR | Status: DC

## 2013-05-22 MED ORDER — ATENOLOL 50 MG PO TABS
50.0000 mg | ORAL_TABLET | Freq: Every day | ORAL | Status: DC
  Filled 2013-05-22 (×3): qty 1

## 2013-05-22 MED ORDER — ENSURE COMPLETE PO LIQD
237.0000 mL | Freq: Two times a day (BID) | ORAL | Status: DC
  Administered 2013-05-23 – 2013-05-24 (×3): 237 mL via ORAL

## 2013-05-22 MED ORDER — POLYETHYLENE GLYCOL 3350 17 G PO PACK
17.0000 g | PACK | Freq: Every day | ORAL | Status: DC | PRN
  Filled 2013-05-22: qty 1

## 2013-05-22 MED ORDER — LISINOPRIL 2.5 MG PO TABS
2.5000 mg | ORAL_TABLET | Freq: Every day | ORAL | Status: DC
  Administered 2013-05-23 – 2013-05-24 (×2): 2.5 mg via ORAL
  Filled 2013-05-22 (×3): qty 1

## 2013-05-22 MED ORDER — RIVAROXABAN 20 MG PO TABS: 20.0000 mg | ORAL_TABLET | Freq: Every day | ORAL | Status: DC

## 2013-05-22 MED ORDER — GUAIFENESIN-DM 100-10 MG/5ML PO SYRP: 5.0000 mL | ORAL_SOLUTION | ORAL | Status: DC | PRN

## 2013-05-22 MED ORDER — ONDANSETRON HCL 4 MG PO TABS: 4.0000 mg | ORAL_TABLET | Freq: Four times a day (QID) | ORAL | Status: DC | PRN

## 2013-05-22 MED ORDER — HYDROCODONE-ACETAMINOPHEN 5-325 MG PO TABS: 1.0000 | ORAL_TABLET | ORAL | Status: DC | PRN

## 2013-05-22 MED ORDER — RIVAROXABAN 15 MG PO TABS
15.0000 mg | ORAL_TABLET | Freq: Two times a day (BID) | ORAL | Status: DC
  Administered 2013-05-23 – 2013-05-24 (×3): 15 mg via ORAL
  Filled 2013-05-22 (×6): qty 1

## 2013-05-22 MED ORDER — SODIUM CHLORIDE 0.9 % IV SOLN
INTRAVENOUS | Status: DC
  Administered 2013-05-22 – 2013-05-24 (×2): via INTRAVENOUS

## 2013-05-22 MED ORDER — VITAMIN D (ERGOCALCIFEROL) 1.25 MG (50000 UNIT) PO CAPS
50000.0000 [IU] | ORAL_CAPSULE | ORAL | Status: DC
  Filled 2013-05-22: qty 1

## 2013-05-22 MED ORDER — ALBUTEROL SULFATE (5 MG/ML) 0.5% IN NEBU: 2.5000 mg | INHALATION_SOLUTION | RESPIRATORY_TRACT | Status: DC | PRN

## 2013-05-22 MED ORDER — DONEPEZIL HCL 10 MG PO TABS
10.0000 mg | ORAL_TABLET | Freq: Every day | ORAL | Status: DC
  Administered 2013-05-23: 10 mg via ORAL
  Filled 2013-05-22 (×3): qty 1

## 2013-05-22 MED ORDER — ONDANSETRON HCL 4 MG/2ML IJ SOLN: 4.0000 mg | Freq: Four times a day (QID) | INTRAMUSCULAR | Status: DC | PRN

## 2013-05-22 NOTE — H&P (Signed)
[facility]                                                                                    Patient Demographics  Wanda Logan, is a 79 y.o. female  CSN: [id number]  MRN: [id number]  DOB - September 21, 1925  Admit Date - 05/22/2013  Outpatient Primary MD for the patient is Emeterio Reeve, MD   With History of -  Past Medical History  Diagnosis Date  . Hypertension   . TIA (transient ischemic attack)   . Peptic ulcer disease   . UTI (lower urinary tract infection)   . Back problem   . High blood cholesterol   . Dysphonia   . Dementia   . Falls frequently   . Cellulitis 02/25/2013    right arm  . Cellulitis of right upper extremity 02/25/2013      Past Surgical History  Procedure Laterality Date  . Cholecystectomy      in for   Chief Complaint  Patient presents with  . Code Stroke     HPI  Wanda Logan  is a 79 y.o. female, With history of dementia, hypertension,  who was recently admitted for a left leg DVT and was placed on xaralto about one week ago went to her primary care physician's office for evaluation of mild bruising around her left ankle, while in her primary care physician's office her family members noted that she was leaning more towards the right side and was getting more confused , Preliminary exam at the family care physician's office was suggestive of right MCA territory CVA with left-sided weakness and possible Left-sided facial droop,She was then brought to the ER.  In the ER patient had a head CT scan, EKG, basic lab work which was all unremarkable, her symptoms had almost completely resolved by the time she was seen by the ED physician, a code stroke was called and she was seen by neurologist and cleared for hospitalist admission. By the time I have seen the patient also focal symptoms have completely resolved.    Review of Systems  Currently negative review of systems  In addition to the HPI above,  No Fever-chills, No Headache, No changes  with Vision or hearing, No problems swallowing food or Liquids, No Chest pain, Cough or Shortness of Breath, No Abdominal pain, No Nausea or Vommitting, Bowel movements are regular, No Blood in stool or Urine, No dysuria, No new skin rashes or bruises, No new joints pains-aches,  No new weakness, tingling, numbness in any extremity, No recent weight gain or loss, No polyuria, polydypsia or polyphagia, No significant Mental Stressors.  A full 10 point Review of Systems was done, except as stated above, all other Review of Systems were negative.   Social History History  Substance Use Topics  . Smoking status: Never Smoker   . Smokeless tobacco: Never Used  . Alcohol Use: No      Family History Family History  Problem Relation Age of Onset  . Stroke Father   . Stroke Mother   . Aneurysm Brother   . Coronary artery disease Sister   . Heart attack Sister   . Stroke Sister   .  Stroke Sister       Prior to Admission medications   Medication Sig Start Date End Date Taking? Authorizing Provider  acetaminophen (TYLENOL) 325 MG tablet Take 650 mg by mouth daily as needed for pain.   Yes Historical Provider, MD  atenolol (TENORMIN) 50 MG tablet Take 1 tablet (50 mg total) by mouth daily. 04/05/13  Yes Lonia Skinner, MD  donepezil (ARICEPT) 10 MG tablet Take 1 tablet (10 mg total) by mouth at bedtime as needed. 04/05/13  Yes Lonia Skinner, MD  feeding supplement (ENSURE COMPLETE) LIQD Take 237 mLs by mouth 2 (two) times daily between meals. 02/27/13  Yes Penny Pia, MD  lisinopril (PRINIVIL,ZESTRIL) 2.5 MG tablet Take 2.5 mg by mouth daily.   Yes Historical Provider, MD  Rivaroxaban (XARELTO) 15 MG TABS tablet Take 1 tablet (15 mg total) by mouth 2 (two) times daily with a meal. 05/14/13 06/02/13 Yes Laveda Norman, MD  traMADol (ULTRAM) 50 MG tablet Take 1 tablet (50 mg total) by mouth every 6 (six) hours as needed for pain. 04/05/13  Yes Lonia Skinner, MD  Vitamin D,  Ergocalciferol, (DRISDOL) 50000 UNITS CAPS Take 1 capsule (50,000 Units total) by mouth every 7 (seven) days. For 8 weeks. Start date: 03/20/13 04/05/13  Yes Lonia Skinner, MD  Rivaroxaban (XARELTO) 20 MG TABS tablet Take 1 tablet (20 mg total) by mouth daily with supper. 06/03/13   Laveda Norman, MD    Allergies  Allergen Reactions  . Aspirin     REACTION: ULCER  . Atorvastatin Other (See Comments)    unknown  . Penicillins Other (See Comments)    unknown    Physical Exam  Vitals  Blood pressure 139/63, pulse 49, temperature 98.5 F (36.9 C), temperature source Oral, resp. rate 18, SpO2 95.00%.   1. General Frail elderly white female lying in bed in NAD,   2. Normal affect and insight, Not Suicidal or Homicidal, Awake , pleasantly confused oriented to place only.  3. No F.N deficits, ALL C.Nerves Intact, Strength 5/5 all 4 extremities, Sensation intact all 4 extremities, Plantars down going.  4. Ears and Eyes appear Normal, Conjunctivae clear, PERRLA. Moist Oral Mucosa.  5. Supple Neck, No JVD, No cervical lymphadenopathy appriciated, No Carotid Bruits.  6. Symmetrical Chest wall movement, Good air movement bilaterally, CTAB.  7. RRR, No Gallops, Rubs or Murmurs, No Parasternal Heave.  8. Positive Bowel Sounds, Abdomen Soft, Non tender, No organomegaly appriciated,No rebound -guarding or rigidity.  9.  No Cyanosis, Normal Skin Turgor, No Skin Rash .  10. Good muscle tone,  joints appear normal , no effusions, Normal ROM. Mild superficial bruising around left ankle  11. No Palpable Lymph Nodes in Neck or Axillae     Data Review  CBC  Recent Labs Lab 05/22/13 1624 05/22/13 1630  WBC 7.7  --   HGB 13.1 13.3  HCT 37.0 39.0  PLT 187  --   MCV 90.7  --   MCH 32.1  --   MCHC 35.4  --   RDW 14.5  --   LYMPHSABS 2.1  --   MONOABS 1.0  --   EOSABS 0.1  --   BASOSABS 0.0  --     ------------------------------------------------------------------------------------------------------------------  Chemistries   Recent Labs Lab 05/22/13 1624 05/22/13 1630  NA 135 137  K 3.6 3.6  CL 97 101  CO2 28  --   GLUCOSE 111* 106*  BUN 21 22  CREATININE 0.96 1.10  CALCIUM 10.1  --   AST 20  --   ALT 9  --   ALKPHOS 59  --   BILITOT 0.7  --    ------------------------------------------------------------------------------------------------------------------ CrCl is unknown because both a height and weight (above a minimum accepted value) are required for this calculation. ------------------------------------------------------------------------------------------------------------------ No results found for this basename: TSH, T4TOTAL, FREET3, T3FREE, THYROIDAB,  in the last 72 hours   Coagulation profile  Recent Labs Lab 05/22/13 1624  INR 2.24*   ------------------------------------------------------------------------------------------------------------------- No results found for this basename: DDIMER,  in the last 72 hours -------------------------------------------------------------------------------------------------------------------  Cardiac Enzymes  Recent Labs Lab 05/22/13 1624  TROPONINI <0.30   ------------------------------------------------------------------------------------------------------------------ No components found with this basename: POCBNP,    ---------------------------------------------------------------------------------------------------------------  Urinalysis    Component Value Date/Time   COLORURINE YELLOW 05/22/2013 1712   APPEARANCEUR CLEAR 05/22/2013 1712   LABSPEC 1.031* 05/22/2013 1712   PHURINE 5.5 05/22/2013 1712   GLUCOSEU NEGATIVE 05/22/2013 1712   HGBUR NEGATIVE 05/22/2013 1712   HGBUR trace-lysed 11/06/2007 1023   BILIRUBINUR SMALL* 05/22/2013 1712   KETONESUR NEGATIVE 05/22/2013 1712   PROTEINUR NEGATIVE  05/22/2013 1712   UROBILINOGEN 1.0 05/22/2013 1712   NITRITE NEGATIVE 05/22/2013 1712   LEUKOCYTESUR TRACE* 05/22/2013 1712    ----------------------------------------------------------------------------------------------------------------  Imaging results:   Ct Head Wo Contrast  05/22/2013   ADDENDUM REPORT: 05/22/2013 16:42  ADDENDUM: These results were called by telephone on 05/22/2013 at 1641 hr to Dr. Thad Ranger, who verbally acknowledged these results.   Electronically Signed   By: Gordan Payment   On: 05/22/2013 16:42   05/22/2013   CLINICAL DATA:  Left-sided weakness and slurred speech.  EXAM: CT HEAD WITHOUT CONTRAST  TECHNIQUE: Contiguous axial images were obtained from the base of the skull through the vertex without intravenous contrast.  COMPARISON:  12/18/2012.  FINDINGS: Stable diffusely enlarged ventricles and subarachnoid spaces. Stable patchy white matter low density in both cerebral hemispheres. Stable previously described small old right cerebral hemisphere infarcts. No intracranial hemorrhage, mass lesion or CT evidence of acute infarction. Unremarkable bones and included paranasal sinuses.  IMPRESSION: 1. No acute abnormality. 2. 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  Electronically Signed: By: Gordan Payment On: 05/22/2013 16:37    My personal review of EKG: Rhythm NSR, no Acute ST changes    Assessment & Plan   1.Right MCA territory TIA - symptoms have completely resolved, her head CT is unremarkable. Of note patient has had recent left leg DVT, she will be admitted to the hospital for TIA rule out stroke, we will initiate stroke protocol, xaralto will be continued, will get echo gram with a bubble study to rule out DVT embolization, will also get MRI MRA head, carotid duplex, check A1c and lipid panel, PT OT and speech evaluation.Neurology following the patient.     2.Hypertension. Home blood  pressure medications will be continued, will allow permissive hypertension if needed.    3.Dementia. Continue home dose Aricept, she is at risk for fall and aspiration along with Delirium.     4.Recent left leg DVT with mild left ankle bruising. Continue on xaralto, check left ankle x-ray to rule out any Unknown injuries.     DVT Prophylaxis Xaralto  AM Labs Ordered, also please review Full Orders  Family Communication: Admission, patients condition and plan of care including tests being ordered have been discussed with the patient and Daughters who indicate understanding and agree with the plan and  Code Status.  Code Status Full  Likely DC to  SNF  Time spent in minutes : 45  Condition GUARDED   Leroy Sea M.D on 05/22/2013 at 6:15 PM  Between 7am to 7pm - Pager - [phone]  After 7pm go to [email or web address] - password [email or web address]  And look for the night coverage person covering me after hours  [facility] Office  [phone]

## 2013-05-22 NOTE — ED Notes (Addendum)
Patient transported to X-ray 

## 2013-05-22 NOTE — ED Notes (Signed)
Per report from [facility] pt was at her primary care office for follow up on DVT treatment (she is on xarelto).  She was noted to be leaning to the R and had L upper extremity weakness with slurring of speech.  Pt has a history of dementia and her daughter reports that she is at her baseline.

## 2013-05-22 NOTE — Consult Note (Signed)
Referring Physician: Criss Alvine    Chief Complaint: Code stroke  HPI:                                                                                                                                         Wanda Logan is an 79 y.o. female who presented to her primary care MD today for follow up appointment for DVT.  Patient was recently diagnosed with LE DVT and placed on Xeralto. While in the office she was noted to have left UE weakness, left facial droop and leaning to the right. Due to these findings patient was sent to [facility] ED as a code stroke. On arrival to ED patient was noted to have a slight left facial droop but no weakness. CT head negative for bleed or acute stroke.  Patient does have dementia at baseline and was noted to have slow cognition. Patient was not a tPA candidate due to being on Xeralto for previously Dx DVT.   Date last known well: Date: 05/22/2013 Time last known well: Time: 15:15 tPA Given: No: on Xeralto  Past Medical History  Diagnosis Date  . Hypertension   . TIA (transient ischemic attack)   . Peptic ulcer disease   . UTI (lower urinary tract infection)   . Back problem   . High blood cholesterol   . Dysphonia   . Dementia   . Falls frequently   . Cellulitis 02/25/2013    right arm  . Cellulitis of right upper extremity 02/25/2013    Past Surgical History  Procedure Laterality Date  . Cholecystectomy      Family History  Problem Relation Age of Onset  . Stroke Father   . Stroke Mother   . Aneurysm Brother   . Coronary artery disease Sister   . Heart attack Sister   . Stroke Sister   . Stroke Sister    Social History:  reports that she has never smoked. She has never used smokeless tobacco. She reports that she does not drink alcohol or use illicit drugs.  Allergies:  Allergies  Allergen Reactions  . Aspirin     REACTION: ULCER  . Atorvastatin Other (See Comments)    unknown  . Penicillins Other (See Comments)    unknown     Medications:  No current facility-administered medications for this encounter.   Current Outpatient Prescriptions  Medication Sig Dispense Refill  . acetaminophen (TYLENOL) 325 MG tablet Take 650 mg by mouth daily as needed for pain.      Marland Kitchen atenolol (TENORMIN) 50 MG tablet Take 1 tablet (50 mg total) by mouth daily.  30 tablet  3  . donepezil (ARICEPT) 10 MG tablet Take 1 tablet (10 mg total) by mouth at bedtime as needed.  30 tablet  2  . feeding supplement (ENSURE COMPLETE) LIQD Take 237 mLs by mouth 2 (two) times daily between meals.  30 Bottle  0  . lisinopril (PRINIVIL,ZESTRIL) 2.5 MG tablet Take 2.5 mg by mouth daily.      . Rivaroxaban (XARELTO) 15 MG TABS tablet Take 1 tablet (15 mg total) by mouth 2 (two) times daily with a meal.  40 tablet  0  . [START ON 06/03/2013] Rivaroxaban (XARELTO) 20 MG TABS tablet Take 1 tablet (20 mg total) by mouth daily with supper.  30 tablet  0  . traMADol (ULTRAM) 50 MG tablet Take 1 tablet (50 mg total) by mouth every 6 (six) hours as needed for pain.  30 tablet  1  . Vitamin D, Ergocalciferol, (DRISDOL) 50000 UNITS CAPS Take 1 capsule (50,000 Units total) by mouth every 7 (seven) days. For 8 weeks. Start date: 03/20/13  8 capsule       ROS:                                                                                                                                       History obtained from the patient  General ROS: negative for - chills, fatigue, fever, night sweats, weight gain or weight loss Psychological ROS: negative for - behavioral disorder, hallucinations, memory difficulties, mood swings or suicidal ideation Ophthalmic ROS: negative for - blurry vision, double vision, eye pain or loss of vision ENT ROS: negative for - epistaxis, nasal discharge, oral lesions, sore throat, tinnitus or vertigo Allergy and Immunology  ROS: negative for - hives or itchy/watery eyes Hematological and Lymphatic ROS: negative for - bleeding problems, bruising or swollen lymph nodes Endocrine ROS: negative for - galactorrhea, hair pattern changes, polydipsia/polyuria or temperature intolerance Respiratory ROS: negative for - cough, hemoptysis, shortness of breath or wheezing Cardiovascular ROS: negative for - chest pain, dyspnea on exertion, edema or irregular heartbeat Gastrointestinal ROS: negative for - abdominal pain, diarrhea, hematemesis, nausea/vomiting or stool incontinence Genito-Urinary ROS: negative for - dysuria, hematuria, incontinence or urinary frequency/urgency Musculoskeletal ROS: negative for - joint swelling or muscular weakness Neurological ROS: as noted in HPI Dermatological ROS: negative for rash and skin lesion changes  Neurologic Examination:  Blood pressure 146/80, pulse 49, temperature 98.5 F (36.9 C), temperature source Oral, resp. rate 18, SpO2 95.00%.   Mental Status: Alert, oriented to hospital but not year or month (known history dementia), thought process is slow.  Speech fluent without evidence of aphasia.  Able to follow simple commands without difficulty. Cranial Nerves: II: Discs flat bilaterally; Visual fields grossly normal, pupils equal, round, reactive to light and accommodation III,IV, VI: ptosis not present, extra-ocular motions intact bilaterally V,VII: smile symmetric, facial light touch sensation normal bilaterally VIII: hearing decreased bilaterally IX,X: gag reflex present XI: bilateral shoulder shrug XII: midline tongue extension Motor: Right : Upper extremity   5/5    Left:     Upper extremity   5/5  Lower extremity   5/5     Lower extremity   5/5 Tone and bulk:normal tone throughout; no atrophy noted Sensory: Pinprick and light touch intact throughout, bilaterally Deep  Tendon Reflexes:  Right: Upper Extremity   Left: Upper extremity   biceps (C-5 to C-6) 2/4   biceps (C-5 to C-6) 2/4 tricep (C7) 2/4    triceps (C7) 2/4 Brachioradialis (C6) 2/4  Brachioradialis (C6) 2/4  Lower Extremity Lower Extremity  quadriceps (L-2 to L-4) 1/4   quadriceps (L-2 to L-4) 1/4 Achilles (S1) 0/4   Achilles (S1) 0/4  Plantars: Right: downgoing   Left: downgoing Cerebellar: normal finger-to-nose,  normal heel-to-shin test Gait: not tested  CV: pulses palpable throughout    Results for orders placed during the hospital encounter of 05/22/13 (from the past 48 hour(s))  CBC     Status: None   Collection Time    05/22/13  4:24 PM      Result Value Range   WBC 7.7  4.0 - 10.5 K/uL   RBC 4.08  3.87 - 5.11 MIL/uL   Hemoglobin 13.1  12.0 - 15.0 g/dL   HCT 14.7  82.9 - 56.2 %   MCV 90.7  78.0 - 100.0 fL   MCH 32.1  26.0 - 34.0 pg   MCHC 35.4  30.0 - 36.0 g/dL   RDW 13.0  86.5 - 78.4 %   Platelets 187  150 - 400 K/uL  DIFFERENTIAL     Status: Abnormal   Collection Time    05/22/13  4:24 PM      Result Value Range   Neutrophils Relative % 58  43 - 77 %   Neutro Abs 4.5  1.7 - 7.7 K/uL   Lymphocytes Relative 28  12 - 46 %   Lymphs Abs 2.1  0.7 - 4.0 K/uL   Monocytes Relative 13 (*) 3 - 12 %   Monocytes Absolute 1.0  0.1 - 1.0 K/uL   Eosinophils Relative 1  0 - 5 %   Eosinophils Absolute 0.1  0.0 - 0.7 K/uL   Basophils Relative 0  0 - 1 %   Basophils Absolute 0.0  0.0 - 0.1 K/uL  POCT I-STAT TROPONIN I     Status: None   Collection Time    05/22/13  4:28 PM      Result Value Range   Troponin i, poc 0.01  0.00 - 0.08 ng/mL   Comment 3            Comment: Due to the release kinetics of cTnI,     a negative result within the first hours     of the onset of symptoms does not rule out     myocardial infarction with certainty.  If myocardial infarction is still suspected,     repeat the test at appropriate intervals.  GLUCOSE, CAPILLARY     Status: Abnormal    Collection Time    05/22/13  4:30 PM      Result Value Range   Glucose-Capillary 105 (*) 70 - 99 mg/dL   Comment 1 Documented in Chart     Comment 2 Notify RN    POCT I-STAT, CHEM 8     Status: Abnormal   Collection Time    05/22/13  4:30 PM      Result Value Range   Sodium 137  135 - 145 mEq/L   Potassium 3.6  3.5 - 5.1 mEq/L   Chloride 101  96 - 112 mEq/L   BUN 22  6 - 23 mg/dL   Creatinine, Ser 2.13  0.50 - 1.10 mg/dL   Glucose, Bld 086 (*) 70 - 99 mg/dL   Calcium, Ion 5.78  4.69 - 1.30 mmol/L   TCO2 26  0 - 100 mmol/L   Hemoglobin 13.3  12.0 - 15.0 g/dL   HCT 62.9  52.8 - 41.3 %   Ct Head Wo Contrast  05/22/2013   ADDENDUM REPORT: 05/22/2013 16:42  ADDENDUM: These results were called by telephone on 05/22/2013 at 1641 hr to Dr. Thad Ranger, who verbally acknowledged these results.   Electronically Signed   By: Gordan Payment   On: 05/22/2013 16:42   05/22/2013   CLINICAL DATA:  Left-sided weakness and slurred speech.  EXAM: CT HEAD WITHOUT CONTRAST  TECHNIQUE: Contiguous axial images were obtained from the base of the skull through the vertex without intravenous contrast.  COMPARISON:  12/18/2012.  FINDINGS: Stable diffusely enlarged ventricles and subarachnoid spaces. Stable patchy white matter low density in both cerebral hemispheres. Stable previously described small old right cerebral hemisphere infarcts. No intracranial hemorrhage, mass lesion or CT evidence of acute infarction. Unremarkable bones and included paranasal sinuses.  IMPRESSION: 1. No acute abnormality. 2. 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  Electronically Signed: By: Gordan Payment On: 05/22/2013 16:37    Felicie Morn PA-C [facility] [phone]  05/22/2013, 4:50 PM   Patient seen and examined.  Clinical course and management discussed.  Necessary edits performed.  I agree with the above.  Assessment and plan of care developed  and discussed below.    Assessment: 79 y.o. female presenting with complaints of left sided weakness and facial droop.  Symptoms have resolved.  Patient on Xarelto for DVT.  CT reviewed and shows no acute changes.  Patient with vascular risk factors.    Stroke Risk Factors - hyperlipidemia and hypertension  Plan: 1. HgbA1c, fasting lipid panel 2. MRI, MRA  of the brain without contrast 3. PT consult, OT consult, Speech consult 4. Echocardiogram 5. Carotid dopplers 6. Prophylactic therapy-Continue Xarelto 7. Risk factor modification 8. Telemetry monitoring 9. Frequent neuro checks  Case discussed with Dr. Ilene Qua, MD [facility] [phone]  05/22/2013  5:25 PM

## 2013-05-22 NOTE — ED Notes (Signed)
MD at bedside. 

## 2013-05-22 NOTE — ED Notes (Signed)
Patient is resting comfortably. 

## 2013-05-22 NOTE — ED Provider Notes (Signed)
CSN: [id number]     Arrival date & time 05/22/13  1616 History   None    Chief Complaint  Patient presents with  . Code Stroke   (Consider location/radiation/quality/duration/timing/severity/associated sxs/prior Treatment) HPI Comments: 79 y.o. F with PMH of TIA, HTN, HPL, and recent L LE DVT currently on anticoagulation, presenting as code stroke.  Pt developed symptoms approximately 1 hour prior to presentation.  Per EMS, daughter stated that she noticed pt having R facial droop, slurred speech, and L-sided weakness.  Per EMS pt having difficulty following commands but moving all extremities spontaneously.  Pt remained alert with VSS en route.  Patient is a 79 y.o. female presenting with extremity weakness. The history is provided by the EMS personnel.  Extremity Weakness This is a new problem. The current episode started today. The problem occurs constantly. The problem has been gradually improving. Associated symptoms include weakness (left-sided). Pertinent negatives include no abdominal pain, chest pain, coughing, fever, headaches, nausea, neck pain, numbness, rash or vomiting. Associated symptoms comments: With R facial droop, slurred speech. Nothing aggravates the symptoms. She has tried nothing for the symptoms. The treatment provided no relief.    Past Medical History  Diagnosis Date  . Hypertension   . TIA (transient ischemic attack)   . Peptic ulcer disease   . UTI (lower urinary tract infection)   . Back problem   . High blood cholesterol   . Dysphonia   . Dementia   . Falls frequently   . Cellulitis 02/25/2013    right arm  . Cellulitis of right upper extremity 02/25/2013   Past Surgical History  Procedure Laterality Date  . Cholecystectomy     Family History  Problem Relation Age of Onset  . Stroke Father   . Stroke Mother   . Aneurysm Brother   . Coronary artery disease Sister   . Heart attack Sister   . Stroke Sister   . Stroke Sister    History  Substance  Use Topics  . Smoking status: Never Smoker   . Smokeless tobacco: Never Used  . Alcohol Use: No   OB History   Grav Para Term Preterm Abortions TAB SAB Ect Mult Living                 Review of Systems  Constitutional: Negative for fever.  HENT: Negative for neck pain and neck stiffness.   Eyes: Negative for photophobia and visual disturbance.  Respiratory: Negative for cough, chest tightness, shortness of breath and wheezing.   Cardiovascular: Negative for chest pain and palpitations.  Gastrointestinal: Negative for nausea, vomiting, abdominal pain and abdominal distention.  Genitourinary: Negative for dysuria.  Musculoskeletal: Positive for extremity weakness. Negative for back pain and gait problem.  Skin: Negative for rash.  Neurological: Positive for facial asymmetry, speech difficulty and weakness (left-sided). Negative for dizziness, syncope, light-headedness, numbness and headaches.  All other systems reviewed and are negative.    Allergies  Aspirin; Atorvastatin; and Penicillins  Home Medications   No current outpatient prescriptions on file. BP 115/70  Pulse 55  Temp(Src) 98.3 F (36.8 C) (Oral)  Resp 20  SpO2 96% Physical Exam  Constitutional: She appears well-developed and well-nourished. No distress.  HENT:  Head: Normocephalic and atraumatic.  Eyes: EOM are normal. Pupils are equal, round, and reactive to light.  Neck: Normal range of motion. Neck supple.  Cardiovascular: Regular rhythm and normal heart sounds.  Bradycardia present.   Pulmonary/Chest: Effort normal and breath sounds normal. No respiratory  distress. She has no wheezes. She has no rales.  Abdominal: Soft. Bowel sounds are normal. She exhibits no distension. There is no tenderness. There is no rebound and no guarding.  Musculoskeletal: Normal range of motion. She exhibits no edema and no tenderness.  Neurological: She is alert. She has normal strength. No cranial nerve deficit or sensory  deficit. Coordination normal. GCS eye subscore is 4. GCS verbal subscore is 5. GCS motor subscore is 6.  H/o dementia- oriented at baseline per daughter  Skin: Skin is warm and dry. No rash noted. She is not diaphoretic.    ED Course  Procedures (including critical care time) Labs Review Labs Reviewed  PROTIME-INR - Abnormal; Notable for the following:    Prothrombin Time 24.1 (*)    INR 2.24 (*)    All other components within normal limits  APTT - Abnormal; Notable for the following:    aPTT 43 (*)    All other components within normal limits  DIFFERENTIAL - Abnormal; Notable for the following:    Monocytes Relative 13 (*)    All other components within normal limits  COMPREHENSIVE METABOLIC PANEL - Abnormal; Notable for the following:    Glucose, Bld 111 (*)    GFR calc non Af Amer 52 (*)    GFR calc Af Amer 60 (*)    All other components within normal limits  URINALYSIS, ROUTINE W REFLEX MICROSCOPIC - Abnormal; Notable for the following:    Specific Gravity, Urine 1.031 (*)    Bilirubin Urine SMALL (*)    Leukocytes, UA TRACE (*)    All other components within normal limits  GLUCOSE, CAPILLARY - Abnormal; Notable for the following:    Glucose-Capillary 105 (*)    All other components within normal limits  URINE MICROSCOPIC-ADD ON - Abnormal; Notable for the following:    Squamous Epithelial / LPF FEW (*)    Casts HYALINE CASTS (*)    All other components within normal limits  PROTIME-INR - Abnormal; Notable for the following:    Prothrombin Time 19.6 (*)    INR 1.71 (*)    All other components within normal limits  POCT I-STAT, CHEM 8 - Abnormal; Notable for the following:    Glucose, Bld 106 (*)    All other components within normal limits  ETHANOL  CBC  TROPONIN I  URINE RAPID DRUG SCREEN (HOSP PERFORMED)  HEMOGLOBIN A1C  LIPID PANEL  BASIC METABOLIC PANEL  CBC  POCT I-STAT TROPONIN I   Imaging Review Dg Chest 2 View  05/22/2013   CLINICAL DATA:  Stroke.  Hypertension.  EXAM: CHEST  2 VIEW  COMPARISON:  02/25/2013.  FINDINGS: The cardiac silhouette remains borderline enlarged. Clear lungs with stable mild prominence of the interstitial markings and mild diffuse peribronchial thickening. Bilateral shoulder degenerative changes. Interval healing of the previously demonstrated sternal fracture.  IMPRESSION: No acute abnormality. Stable borderline cardiomegaly and mild chronic bronchitic changes.   Electronically Signed   By: Gordan Payment   On: 05/22/2013 22:05   Dg Ankle 2 Views Left  05/22/2013   *RADIOLOGY REPORT*  Clinical Data: Bruising of left ankle.  LEFT ANKLE - 2 VIEW  Comparison: None.  Findings: No acute fracture or dislocation is identified.  Soft tissues are unremarkable.  There is no evidence of bony lesion or destruction.  IMPRESSION: Unremarkable left ankle radiographs.   Original Report Authenticated By: Irish Lack, M.D.   Ct Head Wo Contrast  05/22/2013   ADDENDUM REPORT: 05/22/2013 16:42  ADDENDUM: These results were called by telephone on 05/22/2013 at 1641 hr to Dr. Thad Ranger, who verbally acknowledged these results.   Electronically Signed   By: Gordan Payment   On: 05/22/2013 16:42   05/22/2013   CLINICAL DATA:  Left-sided weakness and slurred speech.  EXAM: CT HEAD WITHOUT CONTRAST  TECHNIQUE: Contiguous axial images were obtained from the base of the skull through the vertex without intravenous contrast.  COMPARISON:  12/18/2012.  FINDINGS: Stable diffusely enlarged ventricles and subarachnoid spaces. Stable patchy white matter low density in both cerebral hemispheres. Stable previously described small old right cerebral hemisphere infarcts. No intracranial hemorrhage, mass lesion or CT evidence of acute infarction. Unremarkable bones and included paranasal sinuses.  IMPRESSION: 1. No acute abnormality. 2. 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  Electronically  Signed: By: Gordan Payment On: 05/22/2013 16:37     Date: 05/22/2013  Rate: 54  Rhythm: sinus bradycardia  QRS Axis: normal  Intervals: normal  ST/T Wave abnormalities: normal  Conduction Disutrbances:none  Narrative Interpretation:   Old EKG Reviewed: unchanged   MDM   1. TIA (transient ischemic attack)   2. Abnormality of gait   3. Dementia   4. High blood cholesterol   5. Hypertension    79 y.o. F presenting as code stroke 2/2 acute onset of R facial droop, slurred speech, and L-sided weakness.  Symptom onset 1 hour prior to presentation.    On arrival, pt alert.  Has h/o dementia, mental status at baseline per daughter. R facial droop now not present on exam, EMS reports symptoms improving.  Pt able to move all extremities and follow commands.  No slurred speech on arrival.  No other focal neuro deficits on exam. Pt taken to CT scan.  EKG with no acute ischemic changes. Lab results unremarkable, FSBS of 105.  Troponin negative.  CT head shows no acute infarct; old R cerebral infarcts present.  Lab results WNL.  Pt to be admitted for TIA workup as neurologic deficits now resolved.  Stable at time of admission.  Discussed with attending Dr. Criss Alvine.     Jodean Lima, MD 05/22/13 [phone]

## 2013-05-23 ENCOUNTER — Encounter (HOSPITAL_COMMUNITY): Payer: Self-pay | Admitting: *Deleted

## 2013-05-23 ENCOUNTER — Inpatient Hospital Stay (HOSPITAL_COMMUNITY): Payer: Medicare Other

## 2013-05-23 DIAGNOSIS — I82409 Acute embolism and thrombosis of unspecified deep veins of unspecified lower extremity: Secondary | ICD-10-CM

## 2013-05-23 LAB — CBC
HCT: 32.8 % — ABNORMAL LOW (ref 36.0–46.0)
MCH: 31.9 pg (ref 26.0–34.0)
MCV: 90.9 fL (ref 78.0–100.0)
Platelets: 139 10*3/uL — ABNORMAL LOW (ref 150–400)
RDW: 14.6 % (ref 11.5–15.5)

## 2013-05-23 LAB — BASIC METABOLIC PANEL
BUN: 19 mg/dL (ref 6–23)
CO2: 28 mEq/L (ref 19–32)
Calcium: 9.6 mg/dL (ref 8.4–10.5)
Creatinine, Ser: 0.96 mg/dL (ref 0.50–1.10)
Glucose, Bld: 91 mg/dL (ref 70–99)

## 2013-05-23 LAB — LIPID PANEL
HDL: 49 mg/dL (ref >39)
LDL Cholesterol: 95 mg/dL (ref 0–99)
Total CHOL/HDL Ratio: 3.5 RATIO
Triglycerides: 132 mg/dL (ref <150)
VLDL: 26 mg/dL (ref 0–40)

## 2013-05-23 NOTE — Evaluation (Signed)
Clinical/Bedside Swallow Evaluation Patient Details  Name: FYNN ADEL MRN: [id number] Date of Birth: [birth year]  Today's Date: 05/23/2013 Time: 0910-0939 SLP Time Calculation (min): 29 min  Past Medical History:  Past Medical History  Diagnosis Date  . Hypertension   . TIA (transient ischemic attack)   . Peptic ulcer disease   . UTI (lower urinary tract infection)   . Back problem   . High blood cholesterol   . Dysphonia   . Dementia   . Falls frequently   . Cellulitis 02/25/2013    right arm  . Cellulitis of right upper extremity 02/25/2013   Past Surgical History:  Past Surgical History  Procedure Laterality Date  . Cholecystectomy     HPI:  Niala Stcharles is a 79 y.o. female, With history of dementia, hypertension, who was recently admitted for a left leg DVT and was placed on xaralto about one week ago went to her primary care physician's office for evaluation of mild bruising around her left ankle, while in her primary care physician's office her family members noted that she was leaning more towards the right side and was getting more confused , Preliminary exam at the family care physician's office was suggestive of right MCA territory CVA with left-sided weakness and possible Left-sided facial droop,She was then brought to the ER. In the ER patient had a head CT scan, EKG, basic lab work which was all unremarkable, her symptoms had almost completely resolved by the time she was seen by the ED physician, a code stroke was called and she was seen by neurologist and cleared for hospitalist admission.MD believes pt to have has TIA.    Assessment / Plan / Recommendation Clinical Impression  No evidence of aspiration observed. Pt with functional consumption of PO, confirmed by dtr's report of pts adequate baseline function with occasional cough, (not observed today). Pt does not have history of pna. Will order regular diet with thin liquids.     Aspiration Risk  Mild    Diet  Recommendation Regular;Thin liquid   Liquid Administration via: Cup;Straw Medication Administration: Whole meds with liquid Supervision: Patient able to self feed Postural Changes and/or Swallow Maneuvers: Seated upright 90 degrees    Other  Recommendations Oral Care Recommendations: Oral care BID   Follow Up Recommendations  None    Frequency and Duration        Pertinent Vitals/Pain NA    SLP Swallow Goals     Swallow Study Prior Functional Status       General HPI: Leean Amezcua is a 79 y.o. female, With history of dementia, hypertension, who was recently admitted for a left leg DVT and was placed on xaralto about one week ago went to her primary care physician's office for evaluation of mild bruising around her left ankle, while in her primary care physician's office her family members noted that she was leaning more towards the right side and was getting more confused , Preliminary exam at the family care physician's office was suggestive of right MCA territory CVA with left-sided weakness and possible Left-sided facial droop,She was then brought to the ER. In the ER patient had a head CT scan, EKG, basic lab work which was all unremarkable, her symptoms had almost completely resolved by the time she was seen by the ED physician, a code stroke was called and she was seen by neurologist and cleared for hospitalist admission.MD believes pt to have has TIA.  Type of Study: Bedside swallow evaluation Diet Prior  to this Study: NPO Temperature Spikes Noted: No Respiratory Status: Room air Behavior/Cognition: Alert;Cooperative;Pleasant mood;Confused Oral Cavity - Dentition: Adequate natural dentition Self-Feeding Abilities: Able to feed self Patient Positioning: Upright in bed Baseline Vocal Quality: Clear Volitional Cough: Strong Volitional Swallow: Able to elicit    Oral/Motor/Sensory Function Overall Oral Motor/Sensory Function: Appears within functional limits for tasks  assessed   Ice Chips     Thin Liquid Thin Liquid: Within functional limits    Nectar Thick     Honey Thick     Puree Puree: Within functional limits   Solid   GO    Solid: Within functional limits       Jaylise Peek, Riley Nearing 05/23/2013,9:45 AM

## 2013-05-23 NOTE — ED Provider Notes (Signed)
I saw and evaluated the patient, reviewed the resident's note and I agree with the findings and plan.   Sx resolved after initial presentation, c/w a TIA. Will admit to hospitalist for further w/u.  Audree Camel, MD 05/23/13 1246

## 2013-05-23 NOTE — Progress Notes (Signed)
Echo Lab  2D Echocardiogram completed.  Sebert Stollings L Taliesin Hartlage, RDCS 05/23/2013 10:39 AM

## 2013-05-23 NOTE — Progress Notes (Signed)
VASCULAR LAB PRELIMINARY  PRELIMINARY  PRELIMINARY  PRELIMINARY  Carotid duplex completed.    Preliminary report:  Right :  1-39% ICA stenosis.  Vertebral artery flow is antegrade.  Left:  40-59% internal carotid artery stenosis. Vertebral artery flow is antegrade.     Telma Pyeatt, RVS 05/23/2013, 12:09 PM

## 2013-05-23 NOTE — Evaluation (Signed)
Physical Therapy Evaluation Patient Details Name: Wanda Logan MRN: [id number] DOB: 12-03-1924 Today's Date: 05/23/2013 Time: 4098-1191 PT Time Calculation (min): 27 min  PT Assessment / Plan / Recommendation History of Present Illness  Wanda Logan  is a 79 y.o. female, With history of dementia, hypertension,  who was recently admitted for a left leg DVT and was placed on xaralto about one week ago went to her primary care physician's office for evaluation of mild bruising around her left ankle, while in her primary care physician's office her family members noted that she was leaning more towards the right side and was getting more confused , Preliminary exam at the family care physician's office was suggestive of right MCA territory CVA with left-sided weakness and possible Left-sided facial droop,She was then brought to the ER.   Clinical Impression  Patient evaluated by Physical Therapy with no further acute PT needs identified. All education has been completed and the patient has no further questions.  See below for any follow-up Physial Therapy or equipment needs. PT is signing off. Thank you for this referral.     PT Assessment  All further PT needs can be met in the next venue of care    Follow Up Recommendations  Home health PT;Supervision/Assistance - 24 hour;Supervision for mobility/OOB    Does the patient have the potential to tolerate intense rehabilitation      Barriers to Discharge        Equipment Recommendations  None recommended by PT    Recommendations for Other Services     Frequency      Precautions / Restrictions Precautions Precautions: Fall   Pertinent Vitals/Pain       Mobility  Bed Mobility Bed Mobility: Rolling Left;Left Sidelying to Sit;Sitting - Scoot to Edge of Bed Rolling Left: 4: Min guard Left Sidelying to Sit: 4: Min assist Sitting - Scoot to [place] of Bed: 4: Min assist Details for Bed Mobility Assistance: vc's for hand placement and  truncal assist Transfers Transfers: Sit to Stand;Stand to Sit Sit to Stand: 4: Min assist Stand to Sit: 4: Min assist Details for Transfer Assistance: vc's for hand placement; lifting and steadying assist Ambulation/Gait Ambulation/Gait Assistance: 4: Min assist Ambulation Distance (Feet): 100 Feet Assistive device: Rolling walker Ambulation/Gait Assistance Details: initially mildly unsteady with staggery steps R worse than L and with fatigue, gait degraded to moderately unsteady.  Pt should be able to safely be assisted at home by caregivers. Gait Pattern: Step-through pattern Stairs: No Modified Rankin (Stroke Patients Only) Pre-Morbid Rankin Score: Moderately severe disability Modified Rankin: Moderately severe disability    Exercises     PT Diagnosis:    PT Problem List: Decreased strength;Decreased balance;Decreased activity tolerance;Decreased knowledge of use of DME;Decreased knowledge of precautions;Decreased safety awareness PT Treatment Interventions:       PT Goals(Current goals can be found in the care plan section)    Visit Information  Last PT Received On: 05/23/13 Assistance Needed: +1 History of Present Illness: Wanda Logan  is a 79 y.o. female, With history of dementia, hypertension,  who was recently admitted for a left leg DVT and was placed on xaralto about one week ago went to her primary care physician's office for evaluation of mild bruising around her left ankle, while in her primary care physician's office her family members noted that she was leaning more towards the right side and was getting more confused , Preliminary exam at the family care physician's office was suggestive of right  MCA territory CVA with left-sided weakness and possible Left-sided facial droop,She was then brought to the ER.       Prior Functioning  Home Living Family/patient expects to be discharged to:: Private residence Living Arrangements: Other (Comment) Available Help at  Discharge: Personal care attendant;Family (3 shifts of PCA) Type of Home: House Home Access: Stairs to enter [employer] of Steps: 2 Entrance Stairs-Rails: None Home Layout: One level Home Equipment: Walker - 2 wheels;Hand held shower head (tub seat) Prior Function Level of Independence: Needs assistance Comments: pt used RW, required assist for ambulation 2* frequent falls, decline in mobility in last week Communication Communication: No difficulties Dominant Hand: Right    Cognition  Cognition Arousal/Alertness: Awake/alert Behavior During Therapy: WFL for tasks assessed/performed Overall Cognitive Status: History of cognitive impairments - at baseline    Extremity/Trunk Assessment Upper Extremity Assessment Upper Extremity Assessment: Overall WFL for tasks assessed Lower Extremity Assessment Lower Extremity Assessment: Overall WFL for tasks assessed;Generalized weakness   Balance Balance Balance Assessed: Yes Static Sitting Balance Static Sitting - Balance Support: Feet supported;Left upper extremity supported;Right upper extremity supported Static Sitting - Level of Assistance: 5: Stand by assistance Static Standing Balance Static Standing - Balance Support: Left upper extremity supported Static Standing - Level of Assistance: 4: Min assist Static Standing - Comment/# of Minutes: focused on another task such as washing hands, pt listed moderately Left needing assist to maintain balance  End of Session PT - End of Session Activity Tolerance: Patient limited by lethargy;Patient tolerated treatment well Patient left: in chair;with call bell/phone within reach;with family/visitor present Nurse Communication: Mobility status  GP     Wanda Logan, Eliseo Gum 05/23/2013, 2:53 PM  05/23/2013  Matlock Bing, PT [phone]  (pager)

## 2013-05-23 NOTE — Progress Notes (Signed)
[facility] PROGRESS NOTE  Wanda Logan ZOX:[id number] DOB: 1924/12/16 DOA: 05/22/2013 PCP: Emeterio Reeve, MD   Brief narrative 79 y/o demented female with hx of HTN, TIA, recent LLE DVT on xarelto admitted with left sided weakness AND left facial droop concerning for TIA Vs CVA.   Assessment/Plan: TIA/ CVA Symptoms now resolved.  monitor on tele  head CT negative for acute findings. MRI brain, 2D echo with bubble study given recent LLE DVT pending. Carotid doppler shows non significant stenosis. Follow A1C, lipid panel continue xarelto. PT/ OT and speech eval Appreciate neuro recomemndations   Hypertension.  will allow permissive hypertension  Dementia.  Continue home dose Aricept.  .Recent left leg DVT with mild left ankle bruising  Continue on xaralto   Code Status: full Family Communication: none Disposition Plan: home vs SNF  Consultants:  neurology  Procedures:  none  Antibiotics: None  HPI/Subjective: No overnight issues. patient pleasantly confused  Objective: Filed Vitals:   05/23/13 0525  BP: 118/56  Pulse: 48  Temp: 98.2 F (36.8 C)  Resp: 18   No intake or output data in the 24 hours ending 05/23/13 1252 Filed Weights   05/23/13 0900  Weight: 58.4 kg (128 lb 12 oz)    Exam:   General: elderly female lying in bed in NAD  HEENT: no pallor, moist oral mucosa  Chest: clear b/l, no added sounds  CVS: NS1&S2, no murmurs, rubs or gallop  Abd: soft,NT, ND, BS+  Ext: warm, no edema   CNS: AAOX1, confused, no let sided weakness of facial droop noted    Data Reviewed: Basic Metabolic Panel:  Recent Labs Lab 05/22/13 1624 05/22/13 1630 05/23/13 0715  NA 135 137 139  K 3.6 3.6 3.6  CL 97 101 103  CO2 28  --  28  GLUCOSE 111* 106* 91  BUN 21 22 19   CREATININE 0.96 1.10 0.96  CALCIUM 10.1  --  9.6   Liver Function Tests:  Recent Labs Lab 05/22/13 1624  AST 20  ALT 9  ALKPHOS 59  BILITOT 0.7  PROT 7.1   ALBUMIN 4.0   No results found for this basename: LIPASE, AMYLASE,  in the last 168 hours No results found for this basename: AMMONIA,  in the last 168 hours CBC:  Recent Labs Lab 05/22/13 1624 05/22/13 1630 05/23/13 0715  WBC 7.7  --  4.7  NEUTROABS 4.5  --   --   HGB 13.1 13.3 11.5*  HCT 37.0 39.0 32.8*  MCV 90.7  --  90.9  PLT 187  --  139*   Cardiac Enzymes:  Recent Labs Lab 05/22/13 1624  TROPONINI <0.30   BNP (last 3 results) No results found for this basename: PROBNP,  in the last 8760 hours CBG:  Recent Labs Lab 05/22/13 1630  GLUCAP 105*    Recent Results (from the past 240 hour(s))  MRSA PCR SCREENING     Status: Abnormal   Collection Time    05/13/13  8:35 PM      Result Value Range Status   MRSA by PCR POSITIVE (*) NEGATIVE Final   Comment:            The GeneXpert MRSA Assay (FDA     approved for NASAL specimens     only), is one component of a     comprehensive MRSA colonization     surveillance program. It is not     intended to diagnose MRSA     infection  nor to guide or     monitor treatment for     MRSA infections.     RESULT CALLED TO, READ BACK BY AND VERIFIED WITH:     RN J.HARAWAY AT 0103 05/14/13 BY L.PITT     Studies: Dg Chest 2 View  05/22/2013   CLINICAL DATA:  Stroke. Hypertension.  EXAM: CHEST  2 VIEW  COMPARISON:  02/25/2013.  FINDINGS: The cardiac silhouette remains borderline enlarged. Clear lungs with stable mild prominence of the interstitial markings and mild diffuse peribronchial thickening. Bilateral shoulder degenerative changes. Interval healing of the previously demonstrated sternal fracture.  IMPRESSION: No acute abnormality. Stable borderline cardiomegaly and mild chronic bronchitic changes.   Electronically Signed   By: Gordan Payment   On: 05/22/2013 22:05   Dg Ankle 2 Views Left  05/22/2013   *RADIOLOGY REPORT*  Clinical Data: Bruising of left ankle.  LEFT ANKLE - 2 VIEW  Comparison: None.  Findings: No acute  fracture or dislocation is identified.  Soft tissues are unremarkable.  There is no evidence of bony lesion or destruction.  IMPRESSION: Unremarkable left ankle radiographs.   Original Report Authenticated By: Irish Lack, M.D.   Ct Head Wo Contrast  05/22/2013   ADDENDUM REPORT: 05/22/2013 16:42  ADDENDUM: These results were called by telephone on 05/22/2013 at 1641 hr to Dr. Thad Ranger, who verbally acknowledged these results.   Electronically Signed   By: Gordan Payment   On: 05/22/2013 16:42   05/22/2013   CLINICAL DATA:  Left-sided weakness and slurred speech.  EXAM: CT HEAD WITHOUT CONTRAST  TECHNIQUE: Contiguous axial images were obtained from the base of the skull through the vertex without intravenous contrast.  COMPARISON:  12/18/2012.  FINDINGS: Stable diffusely enlarged ventricles and subarachnoid spaces. Stable patchy white matter low density in both cerebral hemispheres. Stable previously described small old right cerebral hemisphere infarcts. No intracranial hemorrhage, mass lesion or CT evidence of acute infarction. Unremarkable bones and included paranasal sinuses.  IMPRESSION: 1. No acute abnormality. 2. 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  Electronically Signed: By: Gordan Payment On: 05/22/2013 16:37    Scheduled Meds: . atenolol  50 mg Oral Daily  . donepezil  10 mg Oral QHS  . feeding supplement  237 mL Oral BID BM  . lisinopril  2.5 mg Oral Daily  . Rivaroxaban  15 mg Oral BID WC  . [START ON 06/03/2013] Rivaroxaban  20 mg Oral Q supper  . sodium chloride  3 mL Intravenous Q12H  . Vitamin D (Ergocalciferol)  50,000 Units Oral Q7 days   Continuous Infusions: . sodium chloride 50 mL/hr at 05/22/13 2256      Time spent: 25 minutes    Wanda Logan  [facility] Pager [phone]. If 7PM-7AM, please contact night-coverage at [email or web address], password [email or web address] 05/23/2013, 12:52 PM  LOS: 1 day

## 2013-05-23 NOTE — Evaluation (Signed)
Speech Language Pathology Evaluation Patient Details Name: Wanda Logan MRN: [id number] DOB: 25-Sep-1925 Today's Date: 05/23/2013 Time: 4098-1191 SLP Time Calculation (min): 29 min  Problem List:  Patient Active Problem List   Diagnosis Date Noted  . DVT (deep venous thrombosis) 05/13/2013  . Hypokalemia 04/08/2013  . Person living in residential institution 04/03/2013  . Vitamin D deficiency 03/14/2013  . Traumatic hematoma of head 03/11/2013  . Abnormality of gait 03/04/2013  . Falls frequently 02/25/2013  . Hypertension   . TIA (transient ischemic attack)   . Peptic ulcer disease   . Back problem   . High blood cholesterol   . Dysphonia   . Dementia   . OSTEOARTHRITIS 06/07/2007   Past Medical History:  Past Medical History  Diagnosis Date  . Hypertension   . TIA (transient ischemic attack)   . Peptic ulcer disease   . UTI (lower urinary tract infection)   . Back problem   . High blood cholesterol   . Dysphonia   . Dementia   . Falls frequently   . Cellulitis 02/25/2013    right arm  . Cellulitis of right upper extremity 02/25/2013   Past Surgical History:  Past Surgical History  Procedure Laterality Date  . Cholecystectomy     HPI:  Wanda Logan is a 79 y.o. female, With history of dementia, hypertension, who was recently admitted for a left leg DVT and was placed on xaralto about one week ago went to her primary care physician's office for evaluation of mild bruising around her left ankle, while in her primary care physician's office her family members noted that she was leaning more towards the right side and was getting more confused , Preliminary exam at the family care physician's office was suggestive of right MCA territory CVA with left-sided weakness and possible Left-sided facial droop,She was then brought to the ER. In the ER patient had a head CT scan, EKG, basic lab work which was all unremarkable, her symptoms had almost completely resolved by the time  she was seen by the ED physician, a code stroke was called and she was seen by neurologist and cleared for hospitalist admission.MD believes pt to have has TIA.    Assessment / Plan / Recommendation Clinical Impression  Pt does not demonstrate any new behaviors or impairment other than dementia at baseline. Pt is pleasantly disoriented, required max verbal cues to state orientation to place over three trials. She verbalizes basic wants and needs and engages in pleasant conversation, recalls basic personal information well. No need for SLP f/u; pt appears to be at baseline.     SLP Assessment  Patient does not need any further Speech Lanaguage Pathology Services    Follow Up Recommendations  None    Frequency and Duration        Pertinent Vitals/Pain NA   SLP Goals     SLP Evaluation Prior Functioning  Cognitive/Linguistic Baseline: Baseline deficits Baseline deficit details: dementia Type of Home: House  Lives With: Alone;Other (Comment) (has 24 hours supervision by caregivers) Available Help at Discharge: Available 24 hours/day;Family;Friend(s) Vocation: Retired   IT consultant  Overall Cognitive Status: History of cognitive impairments - at baseline Arousal/Alertness: Awake/alert Orientation Level: Oriented to person;Disoriented to place;Disoriented to time;Disoriented to situation Attention: Sustained;Focused Focused Attention: Appears intact Sustained Attention: Appears intact Memory: Impaired Memory Impairment: Storage deficit;Retrieval deficit;Decreased recall of new information;Decreased short term memory Decreased Short Term Memory: Verbal basic Awareness: Impaired Awareness Impairment: Intellectual impairment Problem Solving: Impaired Problem Solving  Impairment: Verbal basic Safety/Judgment: Appears intact    Comprehension  Auditory Comprehension Overall Auditory Comprehension: Appears within functional limits for tasks assessed    Expression Verbal  Expression Overall Verbal Expression: Appears within functional limits for tasks assessed   Oral / Motor Oral Motor/Sensory Function Overall Oral Motor/Sensory Function: Appears within functional limits for tasks assessed Motor Speech Overall Motor Speech: Appears within functional limits for tasks assessed   GO    Harlon Ditty, MA CCC-SLP [phone]  Claudine Mouton 05/23/2013, 9:52 AM

## 2013-05-24 NOTE — Discharge Summary (Addendum)
Physician Discharge Summary  Wanda Logan ZOX:[id number] DOB: [birth year] DOA: 05/22/2013  PCP: Emeterio Reeve, MD  Admit date: 05/22/2013 Discharge date: 05/24/2013  Time spent: 40 minutes  Recommendations for Outpatient Follow-up:  1. Home with home health PT and 24 hr supervision 2. Follow up with PCP in 1 week  Discharge Diagnoses:  Principal Problem:   TIA (transient ischemic attack)  Active Problems:    Hypertension   Peptic ulcer disease   High blood cholesterol   Dementia   DVT (deep venous thrombosis) osteoarthritis   Discharge Condition:fair  Diet recommendation:cardiac  Filed Weights   05/23/13 0900 05/24/13 0456  Weight: 58.4 kg (128 lb 12 oz) 58.8 kg (129 lb 10.1 oz)    History of present illness:  Please refer to admission H&P for details, but in  brief, 79 y/o demented female with hx of HTN, TIA, recent LLE DVT on xarelto admitted with left sided weakness AND left facial droop concerning for TIA.   Hospital Course:  TIA Symptoms now resolved.  stable on tele  head CT negative for acute findings. MRI brain shows no new infarct., 2D echo normal ( unfortunately bubble study not done ). Carotid doppler shows non significant stenosis.   A1C, lipid panel normal continue xarelto.  PT/ OT recommends home with HHPT Appreciate neuro recomemndations  -no further symptoms  Hypertension.   allowed permissive hypertension . Resume home meds   Dementia.  Continue home dose Aricept.   .Recent left leg DVT with mild left ankle bruising  Continue on xaralto  Code Status: full  Family Communication: spoke with daughter Steward Drone over the phone and updated discharge plan. Disposition Plan: home with HHPT   Consultants:  neurology Procedures:  none Antibiotics:  None   Discharge Exam: Filed Vitals:   05/24/13 1003  BP: 171/78  Pulse: 66  Temp: 97.5 F (36.4 C)  Resp: 20   General: elderly female lying in bed in NAD  HEENT: no pallor, moist  oral mucosa  Chest: clear b/l, no added sounds  CVS: NS1&S2, no murmurs, rubs or gallop  Abd: soft,NT, ND, BS+ Ext: warm, no edema  CNS: AAOX2, pleasantly confused, , no let sided weakness of facial droop noted    Discharge Instructions     Medication List         acetaminophen 325 MG tablet  Commonly known as:  TYLENOL  Take 650 mg by mouth daily as needed for pain.     atenolol 50 MG tablet  Commonly known as:  TENORMIN  Take 1 tablet (50 mg total) by mouth daily.     donepezil 10 MG tablet  Commonly known as:  ARICEPT  Take 1 tablet (10 mg total) by mouth at bedtime as needed.     feeding supplement Liqd  Take 237 mLs by mouth 2 (two) times daily between meals.     lisinopril 2.5 MG tablet  Commonly known as:  PRINIVIL,ZESTRIL  Take 2.5 mg by mouth daily.     Rivaroxaban 15 MG Tabs tablet  Commonly known as:  XARELTO  Take 1 tablet (15 mg total) by mouth 2 (two) times daily with a meal.     Rivaroxaban 20 MG Tabs tablet  Commonly known as:  XARELTO  Take 1 tablet (20 mg total) by mouth daily with supper.  Start taking on:  06/03/2013     traMADol 50 MG tablet  Commonly known as:  ULTRAM  Take 1 tablet (50 mg total) by mouth every 6 (  six) hours as needed for pain.     Vitamin D (Ergocalciferol) 50000 UNITS Caps capsule  Commonly known as:  DRISDOL  Take 1 capsule (50,000 Units total) by mouth every 7 (seven) days. For 8 weeks. Start date: 03/20/13       Allergies  Allergen Reactions  . Aspirin     REACTION: ULCER  . Atorvastatin Other (See Comments)    unknown  . Penicillins Other (See Comments)    unknown       Follow-up Information   Follow up with Emeterio Reeve, MD In 1 week.   Specialty:  Family Medicine   Contact information:   [address] [place] [address] [phone]        The results of significant diagnostics from this hospitalization (including imaging, microbiology, ancillary and laboratory) are listed below for  reference.    Significant Diagnostic Studies: Dg Chest 2 View  05/22/2013   CLINICAL DATA:  Stroke. Hypertension.  EXAM: CHEST  2 VIEW  COMPARISON:  02/25/2013.  FINDINGS: The cardiac silhouette remains borderline enlarged. Clear lungs with stable mild prominence of the interstitial markings and mild diffuse peribronchial thickening. Bilateral shoulder degenerative changes. Interval healing of the previously demonstrated sternal fracture.  IMPRESSION: No acute abnormality. Stable borderline cardiomegaly and mild chronic bronchitic changes.   Electronically Signed   By: Gordan Payment   On: 05/22/2013 22:05   Dg Ankle 2 Views Left  05/22/2013   *RADIOLOGY REPORT*  Clinical Data: Bruising of left ankle.  LEFT ANKLE - 2 VIEW  Comparison: None.  Findings: No acute fracture or dislocation is identified.  Soft tissues are unremarkable.  There is no evidence of bony lesion or destruction.  IMPRESSION: Unremarkable left ankle radiographs.   Original Report Authenticated By: Irish Lack, M.D.   Ct Head Wo Contrast  05/22/2013   ADDENDUM REPORT: 05/22/2013 16:42  ADDENDUM: These results were called by telephone on 05/22/2013 at 1641 hr to Dr. Thad Ranger, who verbally acknowledged these results.   Electronically Signed   By: Gordan Payment   On: 05/22/2013 16:42   05/22/2013   CLINICAL DATA:  Left-sided weakness and slurred speech.  EXAM: CT HEAD WITHOUT CONTRAST  TECHNIQUE: Contiguous axial images were obtained from the base of the skull through the vertex without intravenous contrast.  COMPARISON:  12/18/2012.  FINDINGS: Stable diffusely enlarged ventricles and subarachnoid spaces. Stable patchy white matter low density in both cerebral hemispheres. Stable previously described small old right cerebral hemisphere infarcts. No intracranial hemorrhage, mass lesion or CT evidence of acute infarction. Unremarkable bones and included paranasal sinuses.  IMPRESSION: 1. No acute abnormality. 2. Stable atrophy, chronic small  vessel white matter ischemic changes and small, old right cerebral hemisphere infarcts.  Electronically Signed: By: Gordan Payment On: 05/22/2013 16:37   Mr Brain Wo Contrast  05/23/2013   CLINICAL DATA:  79 year old female with hypertension, recent DVT. Left side weakness and left facial droop concerning for stroke or TIA.  EXAM: MRI HEAD WITHOUT CONTRAST  MRA HEAD WITHOUT CONTRAST  TECHNIQUE: Multiplanar, multiecho pulse sequences of the brain and surrounding structures were obtained without intravenous contrast. Angiographic images of the head were obtained using MRA technique without contrast.  COMPARISON:  Head CT 05/22/2013. Brain MRI and MRA 05/21/2006.  FINDINGS: MRI HEAD FINDINGS  No restricted diffusion or evidence of acute infarction. Mild generalized cerebral volume loss since 2007. Chronic ventricular prominence is mildly progressed. No transependymal edema. Major intracranial vascular flow voids are stable. Negative pituitary, cervicomedullary  junction visualized cervical spine. Normal bone marrow signal.  Small areas of chronic encephalomalacia in the superior frontal gyri/peri-Rolandic cortex right greater than left (series 6, image 18) have increased since 2007. Increase nonspecific periventricular white matter T2 and FLAIR hyperintensity. Mild for age T2 heterogeneity in the deep gray matter nuclei also is increased. Brainstem and cerebellum remain within normal limits.  Visualized paranasal sinuses and mastoids are clear. Interval postoperative changes to the globes. Negative scalp soft tissues.  MRA HEAD FINDINGS  Antegrade flow in the posterior circulation. Distal vertebral arteries appear stable. Stable left PICA origin. Suspect artifactually decreased flow signal in the proximal basilar artery. Stable distal basilar artery with bilateral fetal PCA origins. Bilateral PCA branches are stable and within normal limits.  Antegrade flow in both ICA siphons which appears stable and without stenosis.  Tortuous distal cervical left ICA. Ophthalmic and posterior communicating artery origins are within normal limits.  Carotid termini are patent. MCA and ACA origins are stable and within normal limits. Dominant left A1 segment again noted. Diminutive or absent anterior communicating artery. Visualized ACA branches are within normal limits. Visualized left MCA branches are within normal limits. Visualized right MCA branches are stable and within normal limits.  IMPRESSION: MRI HEAD IMPRESSION  1. No acute intracranial abnormality.  2. Progressed small peri-Rolandic cortical infarcts, right greater than left, since 2007.  MRA HEAD IMPRESSION  Suspect artifactual decreased signal in the proximal basilar artery. Stable intracranial MRA since 2007 with no intracranial stenosis or major branch occlusion identified.   Electronically Signed   By: Augusto Gamble   On: 05/23/2013 16:39   Mr Maxine Glenn Head/brain Wo Cm  05/23/2013   CLINICAL DATA:  79 year old female with hypertension, recent DVT. Left side weakness and left facial droop concerning for stroke or TIA.  EXAM: MRI HEAD WITHOUT CONTRAST  MRA HEAD WITHOUT CONTRAST  TECHNIQUE: Multiplanar, multiecho pulse sequences of the brain and surrounding structures were obtained without intravenous contrast. Angiographic images of the head were obtained using MRA technique without contrast.  COMPARISON:  Head CT 05/22/2013. Brain MRI and MRA 05/21/2006.  FINDINGS: MRI HEAD FINDINGS  No restricted diffusion or evidence of acute infarction. Mild generalized cerebral volume loss since 2007. Chronic ventricular prominence is mildly progressed. No transependymal edema. Major intracranial vascular flow voids are stable. Negative pituitary, cervicomedullary junction visualized cervical spine. Normal bone marrow signal.  Small areas of chronic encephalomalacia in the superior frontal gyri/peri-Rolandic cortex right greater than left (series 6, image 18) have increased since 2007. Increase  nonspecific periventricular white matter T2 and FLAIR hyperintensity. Mild for age T2 heterogeneity in the deep gray matter nuclei also is increased. Brainstem and cerebellum remain within normal limits.  Visualized paranasal sinuses and mastoids are clear. Interval postoperative changes to the globes. Negative scalp soft tissues.  MRA HEAD FINDINGS  Antegrade flow in the posterior circulation. Distal vertebral arteries appear stable. Stable left PICA origin. Suspect artifactually decreased flow signal in the proximal basilar artery. Stable distal basilar artery with bilateral fetal PCA origins. Bilateral PCA branches are stable and within normal limits.  Antegrade flow in both ICA siphons which appears stable and without stenosis. Tortuous distal cervical left ICA. Ophthalmic and posterior communicating artery origins are within normal limits.  Carotid termini are patent. MCA and ACA origins are stable and within normal limits. Dominant left A1 segment again noted. Diminutive or absent anterior communicating artery. Visualized ACA branches are within normal limits. Visualized left MCA branches are within normal limits. Visualized right  MCA branches are stable and within normal limits.  IMPRESSION: MRI HEAD IMPRESSION  1. No acute intracranial abnormality.  2. Progressed small peri-Rolandic cortical infarcts, right greater than left, since 2007.  MRA HEAD IMPRESSION  Suspect artifactual decreased signal in the proximal basilar artery. Stable intracranial MRA since 2007 with no intracranial stenosis or major branch occlusion identified.   Electronically Signed   By: Augusto Gamble   On: 05/23/2013 16:39    Microbiology: No results found for this or any previous visit (from the past 240 hour(s)).   Labs: Basic Metabolic Panel:  Recent Labs Lab 05/22/13 1624 05/22/13 1630 05/23/13 0715  NA 135 137 139  K 3.6 3.6 3.6  CL 97 101 103  CO2 28  --  28  GLUCOSE 111* 106* 91  BUN 21 22 19   CREATININE 0.96 1.10  0.96  CALCIUM 10.1  --  9.6   Liver Function Tests:  Recent Labs Lab 05/22/13 1624  AST 20  ALT 9  ALKPHOS 59  BILITOT 0.7  PROT 7.1  ALBUMIN 4.0   No results found for this basename: LIPASE, AMYLASE,  in the last 168 hours No results found for this basename: AMMONIA,  in the last 168 hours CBC:  Recent Labs Lab 05/22/13 1624 05/22/13 1630 05/23/13 0715  WBC 7.7  --  4.7  NEUTROABS 4.5  --   --   HGB 13.1 13.3 11.5*  HCT 37.0 39.0 32.8*  MCV 90.7  --  90.9  PLT 187  --  139*   Cardiac Enzymes:  Recent Labs Lab 05/22/13 1624  TROPONINI <0.30   BNP: BNP (last 3 results) No results found for this basename: PROBNP,  in the last 8760 hours CBG:  Recent Labs Lab 05/22/13 1630  GLUCAP 105*       Signed:  Ivanka Kirshner  [facility] 05/24/2013, 12:43 PM

## 2013-05-24 NOTE — Care Management Note (Signed)
    Page 1 of 1   05/24/2013     1:37:45 PM   CARE MANAGEMENT NOTE 05/24/2013  Patient:  MONI, ROTHROCK   Account Number:  [id number]  Date Initiated:  05/24/2013  Documentation initiated by:  Elmer Bales  Subjective/Objective Assessment:   Patient admitted with TIA. Active with [facility] prior to admission     Action/Plan:   will follow for discharge needs.   Anticipated DC Date:  05/24/2013   Anticipated DC Plan:  HOME W HOME HEALTH SERVICES      DC Planning Services  CM consult      Choice offered to / List presented to:  C-1 Patient        HH arranged  HH-1 RN  HH-2 PT  HH-3 OT      [facility] [place] agency  [facility]   Status of service:  Completed, signed off Medicare Important Message given?   (If response is "NO", the following Medicare IM given date fields will be blank) Date Medicare IM given:   Date Additional Medicare IM given:    Discharge Disposition:  HOME W HOME HEALTH SERVICES  Per UR Regulation:  Reviewed for med. necessity/level of care/duration of stay  If discussed at Long Length of Stay Meetings, dates discussed:    Comments:  05/24/13 1330 Elmer Bales RN, MSN, CM- met with patient and family to verify that they are interested in resuming home health services with [place].  Mary with [facility] was notified of discharge today home with PT/OT/RN.

## 2013-05-24 NOTE — Progress Notes (Signed)
NEURO HOSPITALIST PROGRESS NOTE   SUBJECTIVE:                                                                                                                         Patient has no complaints.  No facial droop or weakness.  OBJECTIVE:                                                                                                                           Vital signs in last 24 hours: Temp:  [97.3 F (36.3 C)-98.4 F (36.9 C)] 97.5 F (36.4 C) (08/29 1003) Pulse Rate:  [48-98] 66 (08/29 1003) Resp:  [20] 20 (08/29 1003) BP: (125-171)/(50-78) 171/78 mmHg (08/29 1003) SpO2:  [95 %-100 %] 100 % (08/29 1003) Weight:  [58.8 kg (129 lb 10.1 oz)] 58.8 kg (129 lb 10.1 oz) (08/29 0456)  Intake/Output from previous day:   Intake/Output this shift:   Nutritional status: General  Past Medical History  Diagnosis Date  . Hypertension   . TIA (transient ischemic attack)   . Peptic ulcer disease   . UTI (lower urinary tract infection)   . Back problem   . High blood cholesterol   . Dysphonia   . Dementia   . Falls frequently   . Cellulitis 02/25/2013    right arm  . Cellulitis of right upper extremity 02/25/2013     Neurologic Exam:  Mental Status: Alert, oriented to hospital.  Speech fluent without evidence of aphasia.  Able to follow 3 step commands without difficulty. Cranial Nerves: II:  Visual fields grossly normal, pupils equal, round, reactive to light and accommodation III,IV, VI: ptosis not present, extra-ocular motions intact bilaterally V,VII: smile symmetric, facial light touch sensation normal bilaterally VIII: hearing normal bilaterally IX,X: gag reflex present XI: bilateral shoulder shrug XII: midline tongue extension Motor: Right : Upper extremity   5/5    Left:     Upper extremity   5/5  Lower extremity   5/5     Lower extremity   5/5 Tone and bulk:normal tone throughout; no atrophy noted Sensory: Pinprick and light touch  intact throughout, bilaterally Deep Tendon Reflexes:  Right: Upper Extremity   Left: Upper extremity   biceps (C-5 to  C-6) 2/4   biceps (C-5 to C-6) 2/4 tricep (C7) 2/4    triceps (C7) 2/4 Brachioradialis (C6) 2/4  Brachioradialis (C6) 2/4  Lower Extremity Lower Extremity  quadriceps (L-2 to L-4) 2/4   quadriceps (L-2 to L-4) 2/4 Achilles (S1) 0/4   Achilles (S1) 0/4  Plantars: Right: downgoing   Left: downgoing Cerebellar: normal finger-to-nose,  normal heel-to-shin test    Lab Results: Lab Results  Component Value Date/Time   CHOL 170 05/23/2013  7:15 AM   Lipid Panel  Recent Labs  05/23/13 0715  CHOL 170  TRIG 132  HDL 49  CHOLHDL 3.5  VLDL 26  LDLCALC 95    Studies/Results: Dg Chest 2 View  05/22/2013   CLINICAL DATA:  Stroke. Hypertension.  EXAM: CHEST  2 VIEW  COMPARISON:  02/25/2013.  FINDINGS: The cardiac silhouette remains borderline enlarged. Clear lungs with stable mild prominence of the interstitial markings and mild diffuse peribronchial thickening. Bilateral shoulder degenerative changes. Interval healing of the previously demonstrated sternal fracture.  IMPRESSION: No acute abnormality. Stable borderline cardiomegaly and mild chronic bronchitic changes.   Electronically Signed   By: Gordan Payment   On: 05/22/2013 22:05   Dg Ankle 2 Views Left  05/22/2013   *RADIOLOGY REPORT*  Clinical Data: Bruising of left ankle.  LEFT ANKLE - 2 VIEW  Comparison: None.  Findings: No acute fracture or dislocation is identified.  Soft tissues are unremarkable.  There is no evidence of bony lesion or destruction.  IMPRESSION: Unremarkable left ankle radiographs.   Original Report Authenticated By: Irish Lack, M.D.   Ct Head Wo Contrast  05/22/2013   ADDENDUM REPORT: 05/22/2013 16:42  ADDENDUM: These results were called by telephone on 05/22/2013 at 1641 hr to Dr. Thad Ranger, who verbally acknowledged these results.   Electronically Signed   By: Gordan Payment   On: 05/22/2013  16:42   05/22/2013   CLINICAL DATA:  Left-sided weakness and slurred speech.  EXAM: CT HEAD WITHOUT CONTRAST  TECHNIQUE: Contiguous axial images were obtained from the base of the skull through the vertex without intravenous contrast.  COMPARISON:  12/18/2012.  FINDINGS: Stable diffusely enlarged ventricles and subarachnoid spaces. Stable patchy white matter low density in both cerebral hemispheres. Stable previously described small old right cerebral hemisphere infarcts. No intracranial hemorrhage, mass lesion or CT evidence of acute infarction. Unremarkable bones and included paranasal sinuses.  IMPRESSION: 1. No acute abnormality. 2. 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  Electronically Signed: By: Gordan Payment On: 05/22/2013 16:37   Mr Brain Wo Contrast  05/23/2013   CLINICAL DATA:  79 year old female with hypertension, recent DVT. Left side weakness and left facial droop concerning for stroke or TIA.  EXAM: MRI HEAD WITHOUT CONTRAST  MRA HEAD WITHOUT CONTRAST  TECHNIQUE: Multiplanar, multiecho pulse sequences of the brain and surrounding structures were obtained without intravenous contrast. Angiographic images of the head were obtained using MRA technique without contrast.  COMPARISON:  Head CT 05/22/2013. Brain MRI and MRA 05/21/2006.  FINDINGS: MRI HEAD FINDINGS  No restricted diffusion or evidence of acute infarction. Mild generalized cerebral volume loss since 2007. Chronic ventricular prominence is mildly progressed. No transependymal edema. Major intracranial vascular flow voids are stable. Negative pituitary, cervicomedullary junction visualized cervical spine. Normal bone marrow signal.  Small areas of chronic encephalomalacia in the superior frontal gyri/peri-Rolandic cortex right greater than left (series 6, image 18) have increased since 2007. Increase nonspecific periventricular white  matter T2 and FLAIR  hyperintensity. Mild for age T2 heterogeneity in the deep gray matter nuclei also is increased. Brainstem and cerebellum remain within normal limits.  Visualized paranasal sinuses and mastoids are clear. Interval postoperative changes to the globes. Negative scalp soft tissues.  MRA HEAD FINDINGS  Antegrade flow in the posterior circulation. Distal vertebral arteries appear stable. Stable left PICA origin. Suspect artifactually decreased flow signal in the proximal basilar artery. Stable distal basilar artery with bilateral fetal PCA origins. Bilateral PCA branches are stable and within normal limits.  Antegrade flow in both ICA siphons which appears stable and without stenosis. Tortuous distal cervical left ICA. Ophthalmic and posterior communicating artery origins are within normal limits.  Carotid termini are patent. MCA and ACA origins are stable and within normal limits. Dominant left A1 segment again noted. Diminutive or absent anterior communicating artery. Visualized ACA branches are within normal limits. Visualized left MCA branches are within normal limits. Visualized right MCA branches are stable and within normal limits.  IMPRESSION: MRI HEAD IMPRESSION  1. No acute intracranial abnormality.  2. Progressed small peri-Rolandic cortical infarcts, right greater than left, since 2007.  MRA HEAD IMPRESSION  Suspect artifactual decreased signal in the proximal basilar artery. Stable intracranial MRA since 2007 with no intracranial stenosis or major branch occlusion identified.   Electronically Signed   By: Augusto Gamble   On: 05/23/2013 16:39   Mr Maxine Glenn Head/brain Wo Cm  05/23/2013   CLINICAL DATA:  79 year old female with hypertension, recent DVT. Left side weakness and left facial droop concerning for stroke or TIA.  EXAM: MRI HEAD WITHOUT CONTRAST  MRA HEAD WITHOUT CONTRAST  TECHNIQUE: Multiplanar, multiecho pulse sequences of the brain and surrounding structures were obtained without intravenous contrast.  Angiographic images of the head were obtained using MRA technique without contrast.  COMPARISON:  Head CT 05/22/2013. Brain MRI and MRA 05/21/2006.  FINDINGS: MRI HEAD FINDINGS  No restricted diffusion or evidence of acute infarction. Mild generalized cerebral volume loss since 2007. Chronic ventricular prominence is mildly progressed. No transependymal edema. Major intracranial vascular flow voids are stable. Negative pituitary, cervicomedullary junction visualized cervical spine. Normal bone marrow signal.  Small areas of chronic encephalomalacia in the superior frontal gyri/peri-Rolandic cortex right greater than left (series 6, image 18) have increased since 2007. Increase nonspecific periventricular white matter T2 and FLAIR hyperintensity. Mild for age T2 heterogeneity in the deep gray matter nuclei also is increased. Brainstem and cerebellum remain within normal limits.  Visualized paranasal sinuses and mastoids are clear. Interval postoperative changes to the globes. Negative scalp soft tissues.  MRA HEAD FINDINGS  Antegrade flow in the posterior circulation. Distal vertebral arteries appear stable. Stable left PICA origin. Suspect artifactually decreased flow signal in the proximal basilar artery. Stable distal basilar artery with bilateral fetal PCA origins. Bilateral PCA branches are stable and within normal limits.  Antegrade flow in both ICA siphons which appears stable and without stenosis. Tortuous distal cervical left ICA. Ophthalmic and posterior communicating artery origins are within normal limits.  Carotid termini are patent. MCA and ACA origins are stable and within normal limits. Dominant left A1 segment again noted. Diminutive or absent anterior communicating artery. Visualized ACA branches are within normal limits. Visualized left MCA branches are within normal limits. Visualized right MCA branches are stable and within normal limits.  IMPRESSION: MRI HEAD IMPRESSION  1. No acute intracranial  abnormality.  2. Progressed small peri-Rolandic cortical infarcts, right greater than left, since 2007.  MRA HEAD  IMPRESSION  Suspect artifactual decreased signal in the proximal basilar artery. Stable intracranial MRA since 2007 with no intracranial stenosis or major branch occlusion identified.   Electronically Signed   By: Augusto Gamble   On: 05/23/2013 16:39    MEDICATIONS                                                                                                                        Scheduled: . atenolol  50 mg Oral Daily  . donepezil  10 mg Oral QHS  . feeding supplement  237 mL Oral BID BM  . lisinopril  2.5 mg Oral Daily  . Rivaroxaban  15 mg Oral BID WC  . [START ON 06/03/2013] Rivaroxaban  20 mg Oral Q supper  . sodium chloride  3 mL Intravenous Q12H  . Vitamin D (Ergocalciferol)  50,000 Units Oral Q7 days    Carotid doppler: Summary:  - Mild technical difficulty due to slight movement of the head and high bifurcations. - Right - 1% to 39% ICA stenosis. Vertebral artery flow. - Left - 40% to 59% ICA stenosis in the distal bulb. Vertebral arery flow is antegrade. Other specific details can be found in the table(s) above. Prepared and Electronically Authenticated by  2D echo: Study Conclusions  - Left ventricle: The cavity size was normal. Systolic function was normal. The estimated ejection fraction was in the range of 55% to 60%. Wall motion was normal; there were no regional wall motion abnormalities. Doppler parameters are consistent with abnormal left ventricular relaxation (grade 1 diastolic dysfunction). - Aortic valve: Trivial regurgitation. - Mitral valve: Moderately calcified annulus. Moderate regurgitation. - Pulmonary arteries: Systolic pressure was mildly increased. PA peak pressure: 35mm Hg (S). Impressions:  - No cardiac source of emboli was indentified. Transthoracic echocardiography.  A1C: 4.9 LDL:  95  ASSESSMENT/PLAN:                                                                                                              79 YO female with transient left sided weakness and facial droop which has fully cleared.  MRI brain negative for acute infarct. Carotid dopplers negative, echo normal, LDL 95 and A1c 4.9.  Patient currently on Xeralto for DVT.  Likely TIA.    No further neurological work up needed.  Neurology will S/O.      Assessment and plan discussed with with attending physician and they are in agreement.    Wanda Morn PA-C [facility] [phone]  05/24/2013, 10:33 AM

## 2013-05-24 NOTE — Evaluation (Signed)
Occupational Therapy Evaluation Patient Details Name: Wanda Logan MRN: [id number] DOB: 04/18/1925 Today's Date: 05/24/2013 Time: 8295-6213 OT Time Calculation (min): 22 min  OT Assessment / Plan / Recommendation History of present illness Wanda Logan  is a 79 y.o. female, With history of dementia, hypertension,  who was recently admitted for a left leg DVT and was placed on xaralto about one week ago went to her primary care physician's office for evaluation of mild bruising around her left ankle, while in her primary care physician's office her family members noted that she was leaning more towards the right side and was getting more confused , Preliminary exam at the family care physician's office was suggestive of right MCA territory CVA with left-sided weakness and possible Left-sided facial droop,She was then brought to the ER.   Clinical Impression   Patient evaluated by Occupational Therapy with no further acute OT needs identified. All education has been completed and the patient has no further questions. See below for any follow-up Occupational Therapy or equipment needs. OT to sign off. Thank you for referral.      OT Assessment  All further OT needs can be met in the next venue of care    Follow Up Recommendations  Home health OT    Barriers to Discharge      Equipment Recommendations  None recommended by OT    Recommendations for Other Services    Frequency       Precautions / Restrictions Precautions Precautions: Fall   Pertinent Vitals/Pain No pain reported    ADL  Eating/Feeding: Modified independent Where Assessed - Eating/Feeding: Chair Grooming: Wash/dry hands;Supervision/safety Where Assessed - Grooming: Unsupported standing Toilet Transfer: Radiographer, therapeutic Method: Sit to Barista: Regular height toilet Toileting - Clothing Manipulation and Hygiene: Minimal assistance Where Assessed - Medical sales representative and Hygiene: Sit to stand from 3-in-1 or toilet Equipment Used: Rolling walker;Gait belt Transfers/Ambulation Related to ADLs: Pt ambulating with RW and needing cues for safety. Pt abandoned RW x2 during bathroom transfers. Pt walking outside of RW base during ambulation. Pt reports "oh yeah I use one of these at home" ADL Comments: Pt supien on arrival and agreeable to OOB. pt completed bathroom transfers. Pt needed (A) with hand hygiene due to soap dispenser, Pt with balance deficits noted. Pt remains high fall risk    OT Diagnosis:    OT Problem List:   OT Treatment Interventions:     OT Goals(Current goals can be found in the care plan section)    Visit Information  Last OT Received On: 05/24/13 Assistance Needed: +1 History of Present Illness: Wanda Logan  is a 79 y.o. female, With history of dementia, hypertension,  who was recently admitted for a left leg DVT and was placed on xaralto about one week ago went to her primary care physician's office for evaluation of mild bruising around her left ankle, while in her primary care physician's office her family members noted that she was leaning more towards the right side and was getting more confused , Preliminary exam at the family care physician's office was suggestive of right MCA territory CVA with left-sided weakness and possible Left-sided facial droop,She was then brought to the ER.       Prior Functioning     Home Living Family/patient expects to be discharged to:: Private residence Living Arrangements: Other (Comment) Available Help at Discharge: Personal care attendant;Family Type of Home: House Home Access: Stairs to enter [employer]  of Steps: 2 Entrance Stairs-Rails: None Home Layout: One level Home Equipment: Walker - 2 wheels;Hand held shower head  Lives With: Alone;Other (Comment) (has 24 /7 caregivers) Prior Function Level of Independence: Needs assistance Comments: pt used RW, required  assist for ambulation 2* frequent falls, decline in mobility in last week Communication Communication: No difficulties Dominant Hand: Right         Vision/Perception Vision - History Baseline Vision: Wears glasses all the time   Cognition  Cognition Arousal/Alertness: Awake/alert Behavior During Therapy: WFL for tasks assessed/performed Overall Cognitive Status: History of cognitive impairments - at baseline    Extremity/Trunk Assessment Upper Extremity Assessment Upper Extremity Assessment: Generalized weakness (shoulder pain reported) Lower Extremity Assessment Lower Extremity Assessment: Defer to PT evaluation Cervical / Trunk Assessment Cervical / Trunk Assessment: Kyphotic     Mobility Bed Mobility Bed Mobility: Supine to Sit;Sitting - Scoot to Edge of Bed Supine to Sit: 6: Modified independent (Device/Increase time);HOB flat Sitting - Scoot to Edge of Bed: 6: Modified independent (Device/Increase time) Transfers Sit to Stand: 5: Supervision;With upper extremity assist;From bed Stand to Sit: 5: Supervision;With upper extremity assist;To chair/3-in-1     Exercise     Balance     End of Session OT - End of Session Activity Tolerance: Patient tolerated treatment well Patient left: in chair;with call bell/phone within reach (RN Riley Lam to apply chair alarm) Nurse Communication: Mobility status  GO     Boone Master B 05/24/2013, 1:16 PM Pager: [phone]

## 2013-05-24 NOTE — Progress Notes (Signed)
Patient discharged this afternoon, assessments remained unchanged prior to d/c. Instructions given to caregiver.

## 2013-06-10 ENCOUNTER — Other Ambulatory Visit (HOSPITAL_COMMUNITY): Payer: Self-pay | Admitting: Family Medicine

## 2013-06-10 ENCOUNTER — Ambulatory Visit (HOSPITAL_COMMUNITY): Payer: Medicare Other

## 2013-06-10 DIAGNOSIS — R1310 Dysphagia, unspecified: Secondary | ICD-10-CM

## 2013-06-11 ENCOUNTER — Ambulatory Visit (HOSPITAL_COMMUNITY)
Admission: RE | Admit: 2013-06-11 | Discharge: 2013-06-11 | Disposition: A | Discharge status: Home / Self Care | Payer: Medicare Other | Source: Ambulatory Visit | Attending: Family Medicine | Admitting: Family Medicine

## 2013-06-11 DIAGNOSIS — Z86718 Personal history of other venous thrombosis and embolism: Secondary | ICD-10-CM | POA: Insufficient documentation

## 2013-06-11 DIAGNOSIS — IMO0002 Reserved for concepts with insufficient information to code with codable children: Secondary | ICD-10-CM | POA: Insufficient documentation

## 2013-06-11 DIAGNOSIS — I10 Essential (primary) hypertension: Secondary | ICD-10-CM | POA: Insufficient documentation

## 2013-06-11 DIAGNOSIS — F0390 Unspecified dementia without behavioral disturbance: Secondary | ICD-10-CM | POA: Insufficient documentation

## 2013-06-11 DIAGNOSIS — Z8673 Personal history of transient ischemic attack (TIA), and cerebral infarction without residual deficits: Secondary | ICD-10-CM | POA: Insufficient documentation

## 2013-06-11 DIAGNOSIS — T17308A Unspecified foreign body in larynx causing other injury, initial encounter: Secondary | ICD-10-CM | POA: Insufficient documentation

## 2013-06-11 DIAGNOSIS — R1313 Dysphagia, pharyngeal phase: Secondary | ICD-10-CM | POA: Insufficient documentation

## 2013-06-11 DIAGNOSIS — R1310 Dysphagia, unspecified: Secondary | ICD-10-CM

## 2013-06-11 NOTE — Procedures (Signed)
Objective Swallowing Evaluation: Modified Barium Swallowing Study  Patient Details  Name: Wanda Logan MRN: [id number] Date of Birth: 1925-06-22  Today's Date: 06/11/2013 Time: 1130-1200 SLP Time Calculation (min): 30 min  Past Medical History:  Past Medical History  Diagnosis Date  . Hypertension   . TIA (transient ischemic attack)   . Peptic ulcer disease   . UTI (lower urinary tract infection)   . Back problem   . High blood cholesterol   . Dysphonia   . Dementia   . Falls frequently   . Cellulitis 02/25/2013    right arm  . Cellulitis of right upper extremity 02/25/2013   Past Surgical History:  Past Surgical History  Procedure Laterality Date  . Cholecystectomy     HPI:    Wanda Logan is a 79 y.o. female with history of dementia, hypertension, who was recently admitted for a left leg DVT and TIA.  During that admission she underwent a bedside swallow eval and was placed on a regular diet, thin liquids with no f/u recommended.    Assessment / Plan / Recommendation Clinical Impression  Dysphagia Diagnosis: Mild pharyngeal phase dysphagia  Clinical impression: Pt presents with a mild pharyngeal dysphagia marked by delayed swallow response, mild pharyngeal residue, and aspiration of thin liquids (with cough) when attempting to swallow a 13 mm barium pill.  Transient, high penetration of thin liquids noted.  Pt has difficulty swallowing solids/pills in conjunction with thin liquids, causing aspiration.  This is remedied by avoiding mixed consistencies and consuming pills whole in puree.  Pt/family educated and handout provided detailing recommendations.  No SLP f/u warranted.      Treatment Recommendation  No treatment recommended at this time    Diet Recommendation Regular;Thin liquid   Liquid Administration via: Cup;Straw Medication Administration: Whole meds with puree Supervision: Patient able to self feed Compensations:  (don't mix solid and liquid consistencies  orally) Postural Changes and/or Swallow Maneuvers: Seated upright 90 degrees    Other  Recommendations Oral Care Recommendations: Oral care BID   Follow Up Recommendations  None           SLP Swallow Goals     General Type of Study: Modified Barium Swallowing Study Diet Prior to this Study: Regular;Thin liquids Temperature Spikes Noted: No Respiratory Status: Room air History of Recent Intubation: No Behavior/Cognition: Alert;Cooperative;Pleasant mood;Confused Oral Cavity - Dentition: Adequate natural dentition Oral Motor / Sensory Function: Within functional limits Self-Feeding Abilities: Able to feed self Patient Positioning: Upright in chair Baseline Vocal Quality: Clear Volitional Cough: Strong Volitional Swallow: Able to elicit Anatomy: Within functional limits Pharyngeal Secretions: Not observed secondary MBS    Reason for Referral     Oral Phase Oral Preparation/Oral Phase Oral Phase: WFL   Pharyngeal Phase Pharyngeal Phase Pharyngeal Phase: Impaired Pharyngeal - Thin Pharyngeal - Thin Straw: Delayed swallow initiation;Penetration/Aspiration during swallow (penetration to cords when drinking thins in comb with solids) Penetration/Aspiration details (thin straw): Material enters airway, CONTACTS cords then ejected out Pharyngeal - Solids Pharyngeal - Puree: Delayed swallow initiation;Pharyngeal residue - valleculae Pharyngeal - Mechanical Soft: Delayed swallow initiation;Pharyngeal residue - valleculae Pharyngeal - Pill: Delayed swallow initiation;Pharyngeal residue - valleculae (aspirated thin liquid with pill)  Cervical Esophageal Phase    GO         Functional Assessment Tool Used: clinical judgement Functional Limitations: Swallowing Swallow Current Status ([id number]): At least 20 percent but less than 40 percent impaired, limited or restricted Swallow Goal Status [phone]): At least 20 percent but  less than 40 percent impaired, limited or  restricted Swallow Discharge Status [phone]): At least 20 percent but less than 40 percent impaired, limited or restricted   Maja Mccaffery L. Samson Frederic, [place] CCC/SLP Pager [phone]  Blenda Mounts Laurice 06/11/2013, 1:25 PM

## 2013-06-29 ENCOUNTER — Encounter (HOSPITAL_BASED_OUTPATIENT_CLINIC_OR_DEPARTMENT_OTHER): Payer: Self-pay

## 2013-06-29 ENCOUNTER — Emergency Department (HOSPITAL_BASED_OUTPATIENT_CLINIC_OR_DEPARTMENT_OTHER)
Admission: EM | Admit: 2013-06-29 | Discharge: 2013-06-29 | Disposition: A | Discharge status: Home / Self Care | Payer: Medicare Other | Attending: Emergency Medicine | Admitting: Emergency Medicine

## 2013-06-29 DIAGNOSIS — F0390 Unspecified dementia without behavioral disturbance: Secondary | ICD-10-CM | POA: Insufficient documentation

## 2013-06-29 DIAGNOSIS — Z8673 Personal history of transient ischemic attack (TIA), and cerebral infarction without residual deficits: Secondary | ICD-10-CM | POA: Insufficient documentation

## 2013-06-29 DIAGNOSIS — H6120 Impacted cerumen, unspecified ear: Secondary | ICD-10-CM | POA: Diagnosis present

## 2013-06-29 DIAGNOSIS — H6121 Impacted cerumen, right ear: Secondary | ICD-10-CM

## 2013-06-29 DIAGNOSIS — Z880 Allergy status to penicillin: Secondary | ICD-10-CM | POA: Insufficient documentation

## 2013-06-29 DIAGNOSIS — Z8711 Personal history of peptic ulcer disease: Secondary | ICD-10-CM | POA: Insufficient documentation

## 2013-06-29 DIAGNOSIS — Z87440 Personal history of urinary (tract) infections: Secondary | ICD-10-CM | POA: Insufficient documentation

## 2013-06-29 DIAGNOSIS — E7800 Pure hypercholesterolemia, unspecified: Secondary | ICD-10-CM | POA: Insufficient documentation

## 2013-06-29 DIAGNOSIS — Z79899 Other long term (current) drug therapy: Secondary | ICD-10-CM | POA: Insufficient documentation

## 2013-06-29 DIAGNOSIS — Z872 Personal history of diseases of the skin and subcutaneous tissue: Secondary | ICD-10-CM | POA: Insufficient documentation

## 2013-06-29 DIAGNOSIS — I10 Essential (primary) hypertension: Secondary | ICD-10-CM | POA: Insufficient documentation

## 2013-06-29 MED ORDER — ACETAMINOPHEN 325 MG PO TABS
650.0000 mg | ORAL_TABLET | Freq: Once | ORAL | Status: AC
  Administered 2013-06-29: 650 mg via ORAL
  Filled 2013-06-29: qty 2

## 2013-06-29 MED ORDER — DOCUSATE SODIUM 50 MG/5ML PO LIQD
ORAL | Status: AC
  Filled 2013-06-29: qty 10

## 2013-06-29 NOTE — ED Notes (Signed)
[facility] for further evaluation of HTN. Went today for right ear feeling stopped up and they found her BP elevated. Daughter reports tat patient had small TIA. On assessment denies any symptoms or complaints with HTN.

## 2013-06-29 NOTE — ED Provider Notes (Signed)
CSN: [id number]     Arrival date & time 06/29/13  1303 History   First MD Initiated Contact with Patient 06/29/13 1339     Chief Complaint  Patient presents with  . Hypertension   (Consider location/radiation/quality/duration/timing/severity/associated sxs/prior Treatment) Patient is a 79 y.o. female presenting with hypertension. The history is provided by the patient.  Hypertension This is a chronic problem. The current episode started 1 to 2 hours ago. The problem occurs constantly. The problem has been gradually improving. Pertinent negatives include no chest pain, no abdominal pain, no headaches and no shortness of breath. Nothing aggravates the symptoms. Nothing relieves the symptoms. She has tried nothing for the symptoms. The treatment provided moderate relief.    Past Medical History  Diagnosis Date  . Hypertension   . TIA (transient ischemic attack)   . Peptic ulcer disease   . UTI (lower urinary tract infection)   . Back problem   . High blood cholesterol   . Dysphonia   . Dementia   . Falls frequently   . Cellulitis 02/25/2013    right arm  . Cellulitis of right upper extremity 02/25/2013   Past Surgical History  Procedure Laterality Date  . Cholecystectomy     Family History  Problem Relation Age of Onset  . Stroke Father   . Stroke Mother   . Aneurysm Brother   . Coronary artery disease Sister   . Heart attack Sister   . Stroke Sister   . Stroke Sister    History  Substance Use Topics  . Smoking status: Never Smoker   . Smokeless tobacco: Never Used  . Alcohol Use: No   OB History   Grav Para Term Preterm Abortions TAB SAB Ect Mult Living                 Review of Systems  Constitutional: Negative for fever and fatigue.  HENT: Negative for congestion, drooling and neck pain.   Eyes: Negative for pain.  Respiratory: Negative for cough and shortness of breath.   Cardiovascular: Negative for chest pain.  Gastrointestinal: Negative for nausea,  vomiting, abdominal pain and diarrhea.  Genitourinary: Negative for dysuria and hematuria.  Musculoskeletal: Negative for back pain and gait problem.  Skin: Negative for color change.  Neurological: Negative for dizziness and headaches.  Hematological: Negative for adenopathy.  Psychiatric/Behavioral: Negative for behavioral problems.  All other systems reviewed and are negative.    Allergies  Aspirin; Atorvastatin; and Penicillins  Home Medications   Current Outpatient Rx  Name  Route  Sig  Dispense  Refill  . acetaminophen (TYLENOL) 325 MG tablet   Oral   Take 650 mg by mouth daily as needed for pain.         Marland Kitchen atenolol (TENORMIN) 50 MG tablet   Oral   Take 1 tablet (50 mg total) by mouth daily.   30 tablet   3   . donepezil (ARICEPT) 10 MG tablet   Oral   Take 1 tablet (10 mg total) by mouth at bedtime as needed.   30 tablet   2   . feeding supplement (ENSURE COMPLETE) LIQD   Oral   Take 237 mLs by mouth 2 (two) times daily between meals.   30 Bottle   0   . lisinopril (PRINIVIL,ZESTRIL) 2.5 MG tablet   Oral   Take 2.5 mg by mouth daily.         Marland Kitchen EXPIRED: Rivaroxaban (XARELTO) 15 MG TABS tablet   Oral  Take 1 tablet (15 mg total) by mouth 2 (two) times daily with a meal.   40 tablet   0   . Rivaroxaban (XARELTO) 20 MG TABS tablet   Oral   Take 1 tablet (20 mg total) by mouth daily with supper.   30 tablet   0   . traMADol (ULTRAM) 50 MG tablet   Oral   Take 1 tablet (50 mg total) by mouth every 6 (six) hours as needed for pain.   30 tablet   1   . Vitamin D, Ergocalciferol, (DRISDOL) 50000 UNITS CAPS   Oral   Take 1 capsule (50,000 Units total) by mouth every 7 (seven) days. For 8 weeks. Start date: 03/20/13   8 capsule       BP 178/89  Pulse 57  Temp(Src) 97.8 F (36.6 C) (Oral)  Resp 18  SpO2 98% Physical Exam  Nursing note and vitals reviewed. Constitutional: She is oriented to person, place, and time. She appears well-developed  and well-nourished.  HENT:  Head: Normocephalic.  Mouth/Throat: Oropharynx is clear and moist. No oropharyngeal exudate.  Left TM appears normal.  Right ear canal is occluded w/ cerumen.   Eyes: Conjunctivae and EOM are normal. Pupils are equal, round, and reactive to light.  Neck: Normal range of motion. Neck supple.  Cardiovascular: Normal rate, regular rhythm, normal heart sounds and intact distal pulses.  Exam reveals no gallop and no friction rub.   No murmur heard. Pulmonary/Chest: Effort normal and breath sounds normal. No respiratory distress. She has no wheezes.  Abdominal: Soft. Bowel sounds are normal. There is no tenderness. There is no rebound and no guarding.  Musculoskeletal: Normal range of motion. She exhibits no edema and no tenderness.  Neurological: She is alert and oriented to person, place, and time. She has normal strength. No cranial nerve deficit or sensory deficit. She displays a negative Romberg sign. Coordination and gait normal.  Normal finger to nose bilaterally. The patient normally ambulates with a walker but is able to stand and walk with minimal assistance.  Skin: Skin is warm and dry.  Psychiatric: She has a normal mood and affect. Her behavior is normal.    ED Course  EAR CERUMEN REMOVAL Date/Time: 06/29/2013 5:27 PM Performed by: Purvis Sheffield, S Authorized by: Purvis Sheffield, S Consent: Verbal consent obtained. Risks and benefits: risks, benefits and alternatives were discussed Consent given by: patient Patient understanding: patient states understanding of the procedure being performed Patient identity confirmed: verbally with patient, arm band, provided demographic data and hospital-assigned identification number Time out: Immediately prior to procedure a "time out" was called to verify the correct patient, procedure, equipment, support staff and site/side marked as required. Local anesthetic: none Location details: right ear Procedure  type: curette and irrigation Patient sedated: no Patient tolerance: Patient tolerated the procedure well with no immediate complications.   (including critical care time) Labs Review Labs Reviewed - No data to display Imaging Review No results found.   Date: 06/29/2013  Rate: 49  Rhythm: sinus bradycardia  QRS Axis: normal  Intervals: normal  ST/T Wave abnormalities: nonspecific T wave changes  Conduction Disutrbances:none  Narrative Interpretation: isolated t wave inversion in lead III which is non-specific  Old EKG Reviewed: changes noted   MDM   1. Cerumen impaction, right   2. Hypertension    2:04 PM 79 y.o. female with known hypertension presents from urgent care due to elevated blood pressure. The patient states that she presented to the  urgent care for removal of cerumen from her right ear. The family notes that her blood pressure there was incidentally noted to be 200/100 and because of this reason the patient was sent here for evaluation. The patient notes that she has had mild gradual onset headache this morning which was mostly relieved with Tylenol. She has been well otherwise and has no complaints of chest pain, shortness of breath, increased swelling in her lower extremities, or other associated symptoms on exam. The patient's blood pressure here is mildly hypertensive 170/80. Will give the patient Tylenol for her headache, clean her ear, and get screening EKG.  3:46 PM: Cerumen impaction removed w/ irrigation and curette.  Pt continues to appear well.   I have discussed the diagnosis/risks/treatment options with the patient and family and believe the pt to be eligible for discharge home to follow-up with pcp in the next 1-2 weeks for evaluation of her BP. We also discussed returning to the ED immediately if new or worsening sx occur. We discussed the sx which are most concerning (e.g., HA, dizziness, sob, cp) that necessitate immediate return. Any new prescriptions provided  to the patient are listed below.    Junius Argyle, MD 06/29/13 1728

## 2014-06-18 ENCOUNTER — Non-Acute Institutional Stay (INDEPENDENT_AMBULATORY_CARE_PROVIDER_SITE_OTHER): Payer: Medicare Other | Admitting: Family Medicine

## 2014-06-18 DIAGNOSIS — Z593 Problems related to living in residential institution: Secondary | ICD-10-CM

## 2014-06-18 NOTE — Progress Notes (Signed)
Patient ID: Wanda Logan, female   DOB: 06-Aug-1925, 80 y.o.   MRN: [id number] Patient showing up on [facility] NH list however not admitted to our service. Added discharge date of today.

## 2014-07-16 ENCOUNTER — Emergency Department (HOSPITAL_COMMUNITY)
Admission: EM | Admit: 2014-07-16 | Discharge: 2014-07-16 | Disposition: A | Discharge status: Home / Self Care | Payer: Medicare Other | Attending: Emergency Medicine | Admitting: Emergency Medicine

## 2014-07-16 ENCOUNTER — Emergency Department (HOSPITAL_COMMUNITY): Payer: Medicare Other

## 2014-07-16 ENCOUNTER — Encounter (HOSPITAL_COMMUNITY): Payer: Self-pay | Admitting: Emergency Medicine

## 2014-07-16 DIAGNOSIS — Z8639 Personal history of other endocrine, nutritional and metabolic disease: Secondary | ICD-10-CM | POA: Insufficient documentation

## 2014-07-16 DIAGNOSIS — Z79899 Other long term (current) drug therapy: Secondary | ICD-10-CM | POA: Insufficient documentation

## 2014-07-16 DIAGNOSIS — Z8719 Personal history of other diseases of the digestive system: Secondary | ICD-10-CM | POA: Diagnosis not present

## 2014-07-16 DIAGNOSIS — Z87440 Personal history of urinary (tract) infections: Secondary | ICD-10-CM | POA: Diagnosis not present

## 2014-07-16 DIAGNOSIS — F0390 Unspecified dementia without behavioral disturbance: Secondary | ICD-10-CM | POA: Insufficient documentation

## 2014-07-16 DIAGNOSIS — M25512 Pain in left shoulder: Secondary | ICD-10-CM | POA: Diagnosis present

## 2014-07-16 DIAGNOSIS — Z872 Personal history of diseases of the skin and subcutaneous tissue: Secondary | ICD-10-CM | POA: Insufficient documentation

## 2014-07-16 DIAGNOSIS — R300 Dysuria: Secondary | ICD-10-CM | POA: Diagnosis not present

## 2014-07-16 DIAGNOSIS — Z880 Allergy status to penicillin: Secondary | ICD-10-CM | POA: Insufficient documentation

## 2014-07-16 DIAGNOSIS — Z8673 Personal history of transient ischemic attack (TIA), and cerebral infarction without residual deficits: Secondary | ICD-10-CM | POA: Diagnosis not present

## 2014-07-16 DIAGNOSIS — I10 Essential (primary) hypertension: Secondary | ICD-10-CM | POA: Insufficient documentation

## 2014-07-16 MED ORDER — TRAMADOL HCL 50 MG PO TABS
50.0000 mg | ORAL_TABLET | Freq: Once | ORAL | Status: DC
  Filled 2014-07-16: qty 1

## 2014-07-16 MED ORDER — TRAMADOL HCL 50 MG PO TABS: 50.0000 mg | ORAL_TABLET | Freq: Four times a day (QID) | ORAL | Status: AC | PRN

## 2014-07-16 NOTE — ED Notes (Signed)
Pt complaining of bilateral shoulder pain more on the left shoulder. sts she fell on shoulder a few weeks ago and has been hurting since.

## 2014-07-16 NOTE — ED Provider Notes (Signed)
CSN: [id number]     Arrival date & time 07/16/14  1308 History   First MD Initiated Contact with Patient 07/16/14 1559     Chief Complaint  Patient presents with  . Shoulder Pain     (Consider location/radiation/quality/duration/timing/severity/associated sxs/prior Treatment) Patient is a 80 y.o. female presenting with shoulder pain. The history is provided by the patient and a caregiver.  Shoulder Pain Pertinent negatives include no chest pain, no abdominal pain, no headaches and no shortness of breath.   patient here with her caregiver. Patient without any direct fall or injury. Patient with complaint of bilateral shoulder pain but much more pain in the left shoulder and limited range of motion as of today. Patient states that she is unable to raise her arm above her head on the left side but she can do that on the right. They said the pain is 8/10 when she tries to do that. Sharp and aching nature. Nonradiating. History of dementia but patient very alert and able to enter questions appropriately.  Past Medical History  Diagnosis Date  . Hypertension   . TIA (transient ischemic attack)   . Peptic ulcer disease   . UTI (lower urinary tract infection)   . Back problem   . High blood cholesterol   . Dysphonia   . Dementia   . Falls frequently   . Cellulitis 02/25/2013    right arm  . Cellulitis of right upper extremity 02/25/2013   Past Surgical History  Procedure Laterality Date  . Cholecystectomy     Family History  Problem Relation Age of Onset  . Stroke Father   . Stroke Mother   . Aneurysm Brother   . Coronary artery disease Sister   . Heart attack Sister   . Stroke Sister   . Stroke Sister    History  Substance Use Topics  . Smoking status: Never Smoker   . Smokeless tobacco: Never Used  . Alcohol Use: No   OB History   Grav Para Term Preterm Abortions TAB SAB Ect Mult Living                 Review of Systems  Constitutional: Negative for fever.  HENT:  Negative for congestion.   Eyes: Negative for redness.  Respiratory: Negative for shortness of breath.   Cardiovascular: Negative for chest pain.  Gastrointestinal: Negative for abdominal pain.  Genitourinary: Positive for dysuria.  Musculoskeletal: Negative for back pain and neck pain.  Skin: Negative for rash.  Neurological: Negative for headaches.  Hematological: Does not bruise/bleed easily.  Psychiatric/Behavioral: Negative for confusion.      Allergies  Aspirin; Atorvastatin; and Penicillins  Home Medications   Prior to Admission medications   Medication Sig Start Date End Date Taking? Authorizing Provider  acetaminophen (TYLENOL) 325 MG tablet Take 650 mg by mouth daily as needed for pain.    Historical Provider, MD  atenolol (TENORMIN) 50 MG tablet Take 1 tablet (50 mg total) by mouth daily. 04/05/13   Lonia SkinnerStephanie E Losq, MD  donepezil (ARICEPT) 10 MG tablet Take 1 tablet (10 mg total) by mouth at bedtime as needed. 04/05/13   Lonia SkinnerStephanie E Losq, MD  feeding supplement (ENSURE COMPLETE) LIQD Take 237 mLs by mouth 2 (two) times daily between meals. 02/27/13   Penny Piarlando Vega, MD  lisinopril (PRINIVIL,ZESTRIL) 2.5 MG tablet Take 2.5 mg by mouth daily.    Historical Provider, MD  Rivaroxaban (XARELTO) 15 MG TABS tablet Take 1 tablet (15 mg total) by mouth  2 (two) times daily with a meal. 05/14/13 06/02/13  Laveda Normanhris N Oti, MD  Rivaroxaban (XARELTO) 20 MG TABS tablet Take 1 tablet (20 mg total) by mouth daily with supper. 06/03/13   Laveda Normanhris N Oti, MD  traMADol (ULTRAM) 50 MG tablet Take 1 tablet (50 mg total) by mouth every 6 (six) hours as needed for pain. 04/05/13   Lonia SkinnerStephanie E Losq, MD  traMADol (ULTRAM) 50 MG tablet Take 1 tablet (50 mg total) by mouth every 6 (six) hours as needed. 07/16/14   Vanetta MuldersScott Drexler Maland, MD  Vitamin D, Ergocalciferol, (DRISDOL) 50000 UNITS CAPS Take 1 capsule (50,000 Units total) by mouth every 7 (seven) days. For 8 weeks. Start date: 03/20/13 04/05/13   Lonia SkinnerStephanie E Losq, MD    BP 160/89  Pulse 60  Temp(Src) 98.3 F (36.8 C)  Resp 18  SpO2 94% Physical Exam  Nursing note and vitals reviewed. Constitutional: She appears well-developed and well-nourished. No distress.  HENT:  Head: Normocephalic and atraumatic.  Mouth/Throat: Oropharynx is clear and moist.  Eyes: Conjunctivae and EOM are normal. Pupils are equal, round, and reactive to light.  Neck: Normal range of motion. Neck supple.  Cardiovascular: Normal rate, regular rhythm and normal heart sounds.   Pulmonary/Chest: Effort normal and breath sounds normal. No respiratory distress.  Abdominal: Soft. Bowel sounds are normal. There is no tenderness.  Musculoskeletal: She exhibits no edema and no tenderness.  Right arm radial pulse 2+. Good range of motion at the elbow and shoulder. No deformity. Left arm very limited range of motion of the left shoulder but no deformity left radial pulse 2+. No significant swelling. Fingers with good range of motion. Sensation intact. Patient unable to raise her arm above her head on the left side. This is due to pain.  Neurological: She is alert. No cranial nerve deficit. She exhibits normal muscle tone. Coordination normal.  Skin: Skin is warm. No rash noted. No erythema.    ED Course  Procedures (including critical care time) Labs Review Labs Reviewed - No data to display  Imaging Review Dg Shoulder Right  07/16/2014   CLINICAL DATA:  Chronic bilateral shoulder pain with worsening of symptoms over the past few days  EXAM: RIGHT SHOULDER - 2+ VIEW  COMPARISON:  Left shoulder series of today's date.  FINDINGS: The bones of the right shoulder are osteopenic. There is narrowing of the glenohumeral joint space and of the subacromial subdeltoid space. There are degenerative changes of the [facility] joint with osteophyte formation. There is no acute fracture nor dislocation. The observed portions of the right clavicle and upper right ribs are intact.  IMPRESSION: There is no acute  bony abnormality of the right shoulder. There are chronic degenerative changes.   Electronically Signed   By: David  Swazil[place]Jordan   On: 07/16/2014 14:10   Dg Shoulder Left  07/16/2014   CLINICAL DATA:  Chronic bilateral shoulder pain left greater than right worsening in the past few days.  EXAM: LEFT SHOULDER - 2+ VIEW  COMPARISON:  None.  FINDINGS: Degenerative changes of the acromioclavicular joint are seen. Some degenerative remodeling of the humeral head is noted as well. No acute fracture or dislocation is seen. The underlying bony thorax is unremarkable.  IMPRESSION: Degenerative change without acute abnormality.   Electronically Signed   By: Alcide CleverMark  Lukens M.D.   On: 07/16/2014 14:10     EKG Interpretation None      MDM   Final diagnoses:  Left shoulder pain  Patient's x-rays consistent with some bilateral of shoulder degenerative changes. No evidence of dislocation. Physical examination highly suspicious for rotator cuff injury to the left shoulder. Patient uses that arm to pull herself up and out of the vehicle. Very possible she could have done an injury to the rotator cuff. Referral provided to orthopedic surgery patient we treated with a sling temporarily and treated with tramadol.  Vanetta Mulders, MD 07/16/14 [phone]

## 2014-07-16 NOTE — Discharge Instructions (Signed)
Wear the sling on the left arm as needed for comfort. Can take it off to bathe. Make an appointment to followup with orthopedics. Referral information provided. Take the tramadol as needed for the pain. Return for any new or worse symptoms.

## 2014-09-07 ENCOUNTER — Emergency Department (HOSPITAL_BASED_OUTPATIENT_CLINIC_OR_DEPARTMENT_OTHER)
Admission: EM | Admit: 2014-09-07 | Discharge: 2014-09-07 | Disposition: A | Discharge status: Home / Self Care | Payer: Medicare Other | Attending: Emergency Medicine | Admitting: Emergency Medicine

## 2014-09-07 ENCOUNTER — Encounter (HOSPITAL_BASED_OUTPATIENT_CLINIC_OR_DEPARTMENT_OTHER): Payer: Self-pay | Admitting: Emergency Medicine

## 2014-09-07 DIAGNOSIS — Z880 Allergy status to penicillin: Secondary | ICD-10-CM | POA: Insufficient documentation

## 2014-09-07 DIAGNOSIS — Z8711 Personal history of peptic ulcer disease: Secondary | ICD-10-CM | POA: Insufficient documentation

## 2014-09-07 DIAGNOSIS — I10 Essential (primary) hypertension: Secondary | ICD-10-CM | POA: Diagnosis not present

## 2014-09-07 DIAGNOSIS — Z8639 Personal history of other endocrine, nutritional and metabolic disease: Secondary | ICD-10-CM | POA: Insufficient documentation

## 2014-09-07 DIAGNOSIS — L03115 Cellulitis of right lower limb: Secondary | ICD-10-CM | POA: Diagnosis not present

## 2014-09-07 DIAGNOSIS — Z7901 Long term (current) use of anticoagulants: Secondary | ICD-10-CM | POA: Diagnosis not present

## 2014-09-07 DIAGNOSIS — Z792 Long term (current) use of antibiotics: Secondary | ICD-10-CM | POA: Diagnosis not present

## 2014-09-07 DIAGNOSIS — Z8673 Personal history of transient ischemic attack (TIA), and cerebral infarction without residual deficits: Secondary | ICD-10-CM | POA: Diagnosis not present

## 2014-09-07 DIAGNOSIS — F0390 Unspecified dementia without behavioral disturbance: Secondary | ICD-10-CM | POA: Diagnosis not present

## 2014-09-07 DIAGNOSIS — Z87440 Personal history of urinary (tract) infections: Secondary | ICD-10-CM | POA: Insufficient documentation

## 2014-09-07 DIAGNOSIS — Z79899 Other long term (current) drug therapy: Secondary | ICD-10-CM | POA: Insufficient documentation

## 2014-09-07 DIAGNOSIS — Z9181 History of falling: Secondary | ICD-10-CM | POA: Diagnosis not present

## 2014-09-07 DIAGNOSIS — R2241 Localized swelling, mass and lump, right lower limb: Secondary | ICD-10-CM | POA: Diagnosis present

## 2014-09-07 LAB — CBC WITH DIFFERENTIAL/PLATELET
BASOS PCT: 0 % (ref 0–1)
Basophils Absolute: 0 10*3/uL (ref 0.0–0.1)
EOS ABS: 0.1 10*3/uL (ref 0.0–0.7)
EOS PCT: 1 % (ref 0–5)
HCT: 37.7 % (ref 36.0–46.0)
Hemoglobin: 12.8 g/dL (ref 12.0–15.0)
Lymphocytes Relative: 18 % (ref 12–46)
Lymphs Abs: 1.3 10*3/uL (ref 0.7–4.0)
MCH: 31.1 pg (ref 26.0–34.0)
MCHC: 34 g/dL (ref 30.0–36.0)
MCV: 91.5 fL (ref 78.0–100.0)
Monocytes Absolute: 0.7 10*3/uL (ref 0.1–1.0)
Monocytes Relative: 10 % (ref 3–12)
NEUTROS PCT: 71 % (ref 43–77)
Neutro Abs: 5 10*3/uL (ref 1.7–7.7)
PLATELETS: 128 10*3/uL — AB (ref 150–400)
RBC: 4.12 MIL/uL (ref 3.87–5.11)
RDW: 13.8 % (ref 11.5–15.5)
WBC: 7.1 10*3/uL (ref 4.0–10.5)

## 2014-09-07 LAB — BASIC METABOLIC PANEL
Anion gap: 13 (ref 5–15)
BUN: 18 mg/dL (ref 6–23)
CALCIUM: 9.4 mg/dL (ref 8.4–10.5)
CO2: 27 mEq/L (ref 19–32)
Chloride: 105 mEq/L (ref 96–112)
Creatinine, Ser: 1 mg/dL (ref 0.50–1.10)
GFR, EST AFRICAN AMERICAN: 57 mL/min — AB (ref >90)
GFR, EST NON AFRICAN AMERICAN: 49 mL/min — AB (ref >90)
Glucose, Bld: 98 mg/dL (ref 70–99)
POTASSIUM: 3.9 meq/L (ref 3.7–5.3)
SODIUM: 145 meq/L (ref 137–147)

## 2014-09-07 MED ORDER — VANCOMYCIN HCL 10 G IV SOLR
1250.0000 mg | INTRAVENOUS | Status: DC
  Filled 2014-09-07: qty 1250

## 2014-09-07 MED ORDER — CLINDAMYCIN HCL 150 MG PO CAPS
150.0000 mg | ORAL_CAPSULE | Freq: Four times a day (QID) | ORAL | Status: AC
Start: 2014-09-07 — End: ?

## 2014-09-07 MED ORDER — VANCOMYCIN HCL IN DEXTROSE 1-5 GM/200ML-% IV SOLN
INTRAVENOUS | Status: AC
  Administered 2014-09-07: 1 g
  Filled 2014-09-07: qty 200

## 2014-09-07 MED ORDER — DIPHENHYDRAMINE HCL 50 MG/ML IJ SOLN
INTRAMUSCULAR | Status: AC
Start: 2014-09-07 — End: 2014-09-07
  Administered 2014-09-07: 15:00:00 12.5 mg via INTRAVENOUS
  Filled 2014-09-07: qty 1

## 2014-09-07 MED ORDER — VANCOMYCIN HCL 500 MG IV SOLR
INTRAVENOUS | Status: AC
Start: 2014-09-07 — End: 2014-09-07
  Administered 2014-09-07: 250 mg
  Filled 2014-09-07: qty 500

## 2014-09-07 MED ORDER — DIPHENHYDRAMINE HCL 50 MG/ML IJ SOLN
12.5000 mg | Freq: Once | INTRAMUSCULAR | Status: AC
  Administered 2014-09-07: 12.5 mg via INTRAVENOUS

## 2014-09-07 NOTE — ED Notes (Signed)
PT presents to ED with complaints of right foot swelling and redness since Friday

## 2014-09-07 NOTE — Progress Notes (Addendum)
ANTIBIOTIC CONSULT NOTE - INITIAL  Pharmacy Consult for vancomycin Indication: R foot cellulitis  Allergies  Allergen Reactions  . Aspirin     REACTION: ULCER  . Atorvastatin Other (See Comments)    unknown  . Penicillins Other (See Comments)    unknown    Patient Measurements: Weight: 135 lb (61.236 kg)  Vital Signs: Temp: 98.3 F (36.8 C) (12/13 1201) Temp Source: Oral (12/13 1201) BP: 191/97 mmHg (12/13 1206) Pulse Rate: 75 (12/13 1206) Intake/Output from previous day:   Intake/Output from this shift:    Labs: No results for input(s): WBC, HGB, PLT, LABCREA, CREATININE in the last 72 hours. CrCl cannot be calculated (Unknown ideal weight.). No results for input(s): VANCOTROUGH, VANCOPEAK, VANCORANDOM, GENTTROUGH, GENTPEAK, GENTRANDOM, TOBRATROUGH, TOBRAPEAK, TOBRARND, AMIKACINPEAK, AMIKACINTROU, AMIKACIN in the last 72 hours.   Microbiology: No results found for this or any previous visit (from the past 720 hour(s)).  Medical History: Past Medical History  Diagnosis Date  . Hypertension   . TIA (transient ischemic attack)   . Peptic ulcer disease   . UTI (lower urinary tract infection)   . Back problem   . High blood cholesterol   . Dysphonia   . Dementia   . Falls frequently   . Cellulitis 02/25/2013    right arm  . Cellulitis of right upper extremity 02/25/2013    Medications:  See electronic PTA med list  Assessment: 80 y/o female [facility] swelling and redness. Pharmacy consulted to begin vancomycin. No fever documented. Labs are pending. SCr was normal in 04/2013. Est CrCl ~44 ml/min.  Goal of Therapy:  Vancomycin trough level 10-15 mcg/ml  Plan:  - Vancomycin 1250 mg IV q24h - Will f/u pending BMET - monitor renal function and clinical course  [facility] CenterJennifer Harmony, [address] BoulevardPharm.D., BCPS Clinical Pharmacist Pager: [phone] 09/07/2014 1:34 PM   Addendum: Renal function is normal. WBC are normal. Platelets are low. Continue  with plan above  [facility] WestJennifer [place], [address]Ph[address]D., BCPS Clinical Pharmacist Pager: [phone] 09/07/2014 2:53 PM

## 2014-09-07 NOTE — Discharge Instructions (Signed)
Recheck with your physician this week. Take an over-the-counter probiotic twice per day to avoid diarrhea from the antibiotic. Recheck here with any worsening symptoms.  Cellulitis Cellulitis is an infection of the skin and the tissue under the skin. The infected area is usually red and tender. This happens most often in the arms and lower legs. HOME CARE   Take your antibiotic medicine as told. Finish the medicine even if you start to feel better.  Keep the infected arm or leg raised (elevated).  Put a warm cloth on the area up to 4 times per day.  Only take medicines as told by your doctor.  Keep all doctor visits as told. GET HELP IF:  You see red streaks on the skin coming from the infected area.  Your red area gets bigger or turns a dark color.  Your bone or joint under the infected area is painful after the skin heals.  Your infection comes back in the same area or different area.  You have a puffy (swollen) bump in the infected area.  You have new symptoms.  You have a fever. GET HELP RIGHT AWAY IF:   You feel very sleepy.  You throw up (vomit) or have watery poop (diarrhea).  You feel sick and have muscle aches and pains. MAKE SURE YOU:   Understand these instructions.  Will watch your condition.  Will get help right away if you are not doing well or get worse. Document Released: 02/29/2008 Document Revised: 01/27/2014 Document Reviewed: 11/28/2011 [facility] Patient Information 2015 [employer], [place]. This information is not intended to replace advice given to you by your health care provider. Make sure you discuss any questions you have with your health care provider.

## 2014-09-07 NOTE — ED Provider Notes (Signed)
CSN: [id number]     Arrival date & time 09/07/14  1157 History   First MD Initiated Contact with Patient 09/07/14 1318     Chief Complaint  Patient presents with  . Foot Swelling  . Cellulitis      HPI  Patient presents evaluation of redness of the right lower leg. No fevers or other abnormality is at home. Noticed redness and swelling since Friday. Little more pronounced this morning and daughter presents her here. No fever shakes or chills. No nausea or vomiting. Lives independently.  Past Medical History  Diagnosis Date  . Hypertension   . TIA (transient ischemic attack)   . Peptic ulcer disease   . UTI (lower urinary tract infection)   . Back problem   . High blood cholesterol   . Dysphonia   . Dementia   . Falls frequently   . Cellulitis 02/25/2013    right arm  . Cellulitis of right upper extremity 02/25/2013   Past Surgical History  Procedure Laterality Date  . Cholecystectomy     Family History  Problem Relation Age of Onset  . Stroke Father   . Stroke Mother   . Aneurysm Brother   . Coronary artery disease Sister   . Heart attack Sister   . Stroke Sister   . Stroke Sister    History  Substance Use Topics  . Smoking status: Never Smoker   . Smokeless tobacco: Never Used  . Alcohol Use: No   OB History    No data available     Review of Systems  Constitutional: Negative for fever, chills, diaphoresis, appetite change and fatigue.  HENT: Negative for mouth sores, sore throat and trouble swallowing.   Eyes: Negative for visual disturbance.  Respiratory: Negative for cough, chest tightness, shortness of breath and wheezing.   Cardiovascular: Negative for chest pain.  Gastrointestinal: Negative for nausea, vomiting, abdominal pain, diarrhea and abdominal distention.  Endocrine: Negative for polydipsia, polyphagia and polyuria.  Genitourinary: Negative for dysuria, frequency and hematuria.  Musculoskeletal: Negative for gait problem.  Skin: Positive for  color change. Negative for pallor and rash.       Redness of the right lower leg. Warm to the touch  Neurological: Negative for dizziness, syncope, light-headedness and headaches.  Hematological: Does not bruise/bleed easily.  Psychiatric/Behavioral: Negative for behavioral problems and confusion.      Allergies  Aspirin; Atorvastatin; and Penicillins  Home Medications   Prior to Admission medications   Medication Sig Start Date End Date Taking? Authorizing Provider  acetaminophen (TYLENOL) 325 MG tablet Take 650 mg by mouth 2 (two) times daily as needed for mild pain.     Historical Provider, MD  atenolol (TENORMIN) 50 MG tablet Take 1 tablet (50 mg total) by mouth daily. 04/05/13   Lonia SkinnerStephanie E Losq, MD  clindamycin (CLEOCIN) 150 MG capsule Take 1 capsule (150 mg total) by mouth every 6 (six) hours. 09/07/14   Rolland PorterMark Yamilet Mcfayden, MD  donepezil (ARICEPT) 10 MG tablet Take 1 tablet (10 mg total) by mouth at bedtime as needed. 04/05/13   Lonia SkinnerStephanie E Losq, MD  loratadine (CLARITIN) 10 MG tablet Take 10 mg by mouth every evening.    Historical Provider, MD  losartan (COZAAR) 25 MG tablet Take 25 mg by mouth every evening.    Historical Provider, MD  Red Yeast Rice 600 MG CAPS Take 600 mg by mouth 2 (two) times daily.    Historical Provider, MD  Rivaroxaban (XARELTO) 20 MG TABS tablet Take  1 tablet (20 mg total) by mouth daily with supper. 06/03/13   Laveda Normanhris N Oti, MD  traMADol (ULTRAM) 50 MG tablet Take 1 tablet (50 mg total) by mouth every 6 (six) hours as needed. 07/16/14   Vanetta MuldersScott Zackowski, MD   BP 191/97 mmHg  Pulse 75  Temp(Src) 98.3 F (36.8 C) (Oral)  Resp 18  Wt 135 lb (61.236 kg)  SpO2 97% Physical Exam  Constitutional: She is oriented to person, place, and time. She appears well-developed and well-nourished. No distress.  HENT:  Head: Normocephalic.  Eyes: Conjunctivae are normal. Pupils are equal, round, and reactive to light. No scleral icterus.  Neck: Normal range of motion. Neck  supple. No thyromegaly present.  Cardiovascular: Normal rate and regular rhythm.  Exam reveals no gallop and no friction rub.   No murmur heard. Pulmonary/Chest: Effort normal and breath sounds normal. No respiratory distress. She has no wheezes. She has no rales.  Abdominal: Soft. Bowel sounds are normal. She exhibits no distension. There is no tenderness. There is no rebound.  Musculoskeletal: Normal range of motion.       Legs: Neurological: She is alert and oriented to person, place, and time.  Skin: Skin is warm and dry. No rash noted.  Psychiatric: She has a normal mood and affect. Her behavior is normal.    ED Course  Procedures (including critical care time) Labs Review Labs Reviewed  CBC WITH DIFFERENTIAL - Abnormal; Notable for the following:    Platelets 128 (*)    All other components within normal limits  BASIC METABOLIC PANEL - Abnormal; Notable for the following:    GFR calc non Af Amer 49 (*)    GFR calc Af Amer 57 (*)    All other components within normal limits    Imaging Review No results found.   EKG Interpretation None      MDM   Final diagnoses:  Cellulitis of right lower extremity    Afebrile. Does not appear septic toxic. Some redness and discomfort in the leg. Warm to the touch. Not swollen recorded to suggest DVT. Plan is IV antibiotics. Patient and daughter feel strongly that they would like to be treated at home. I think this is appropriate. Her labs are reassuring. After antibiotics are completed to think discharge home with and a mycin and close primary care follow-up is appropriate.    Rolland PorterMark Bennetta Rudden, MD 09/07/14 [phone]

## 2014-09-07 NOTE — ED Notes (Signed)
Patient with redness and itching to scalp and face, no respiratory distress. MD notified and Benadryl ordered

## 2014-11-26 ENCOUNTER — Encounter: Payer: Self-pay | Admitting: Internal Medicine

## 2014-12-05 IMAGING — CR DG SHOULDER 2+V*L*
3 series · 3 of 3 positions shown · non-contrast
Comparison: None.

CLINICAL DATA: Chronic bilateral shoulder pain left greater than
right worsening in the past few days.

EXAM:
LEFT SHOULDER - 2+ VIEW

[w shoulder internal left]
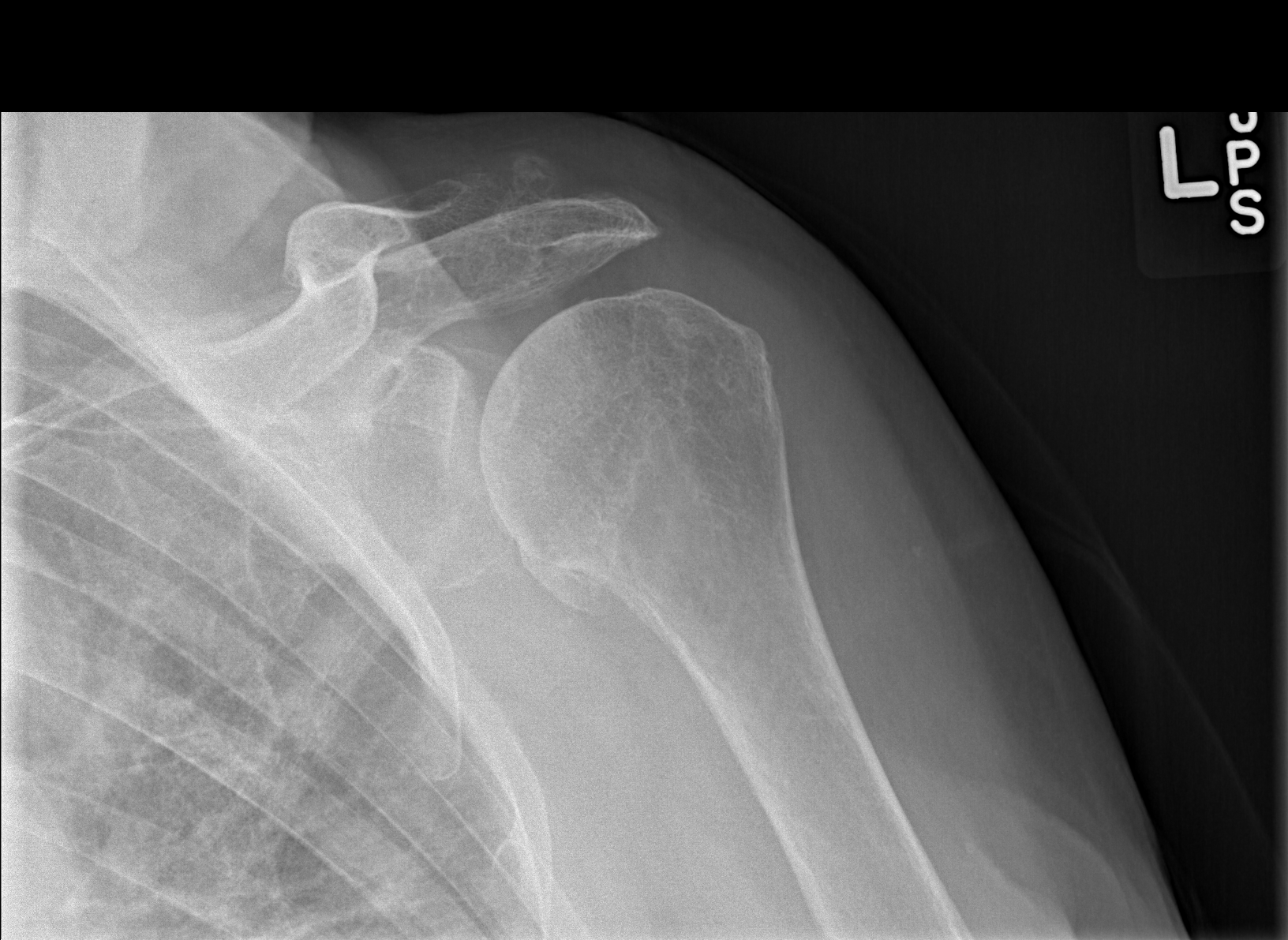

[w shoulder y-view left]
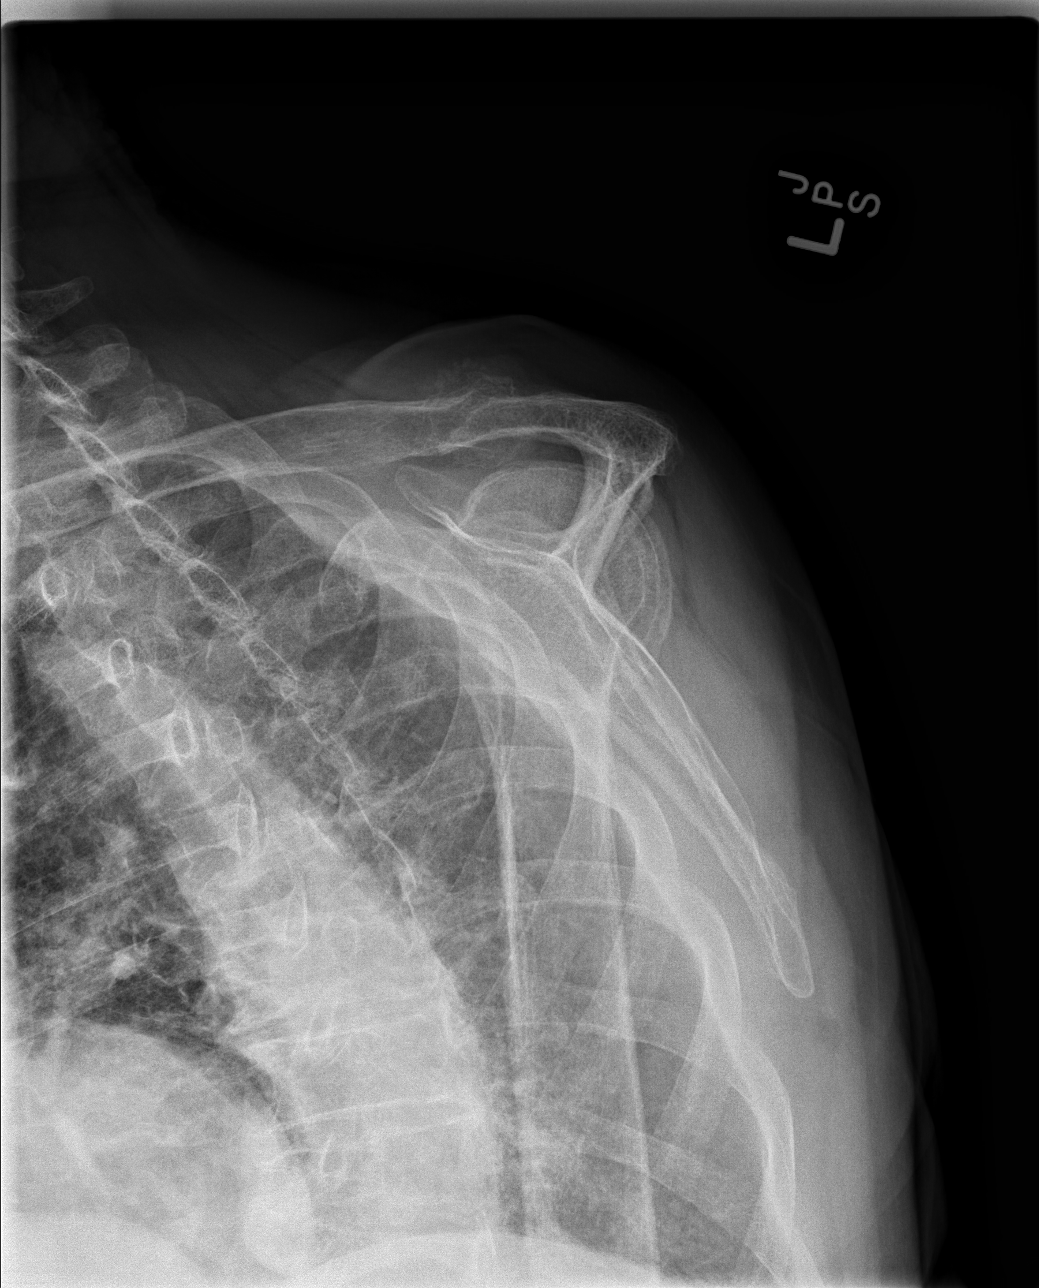

[x shoulder axillary left]
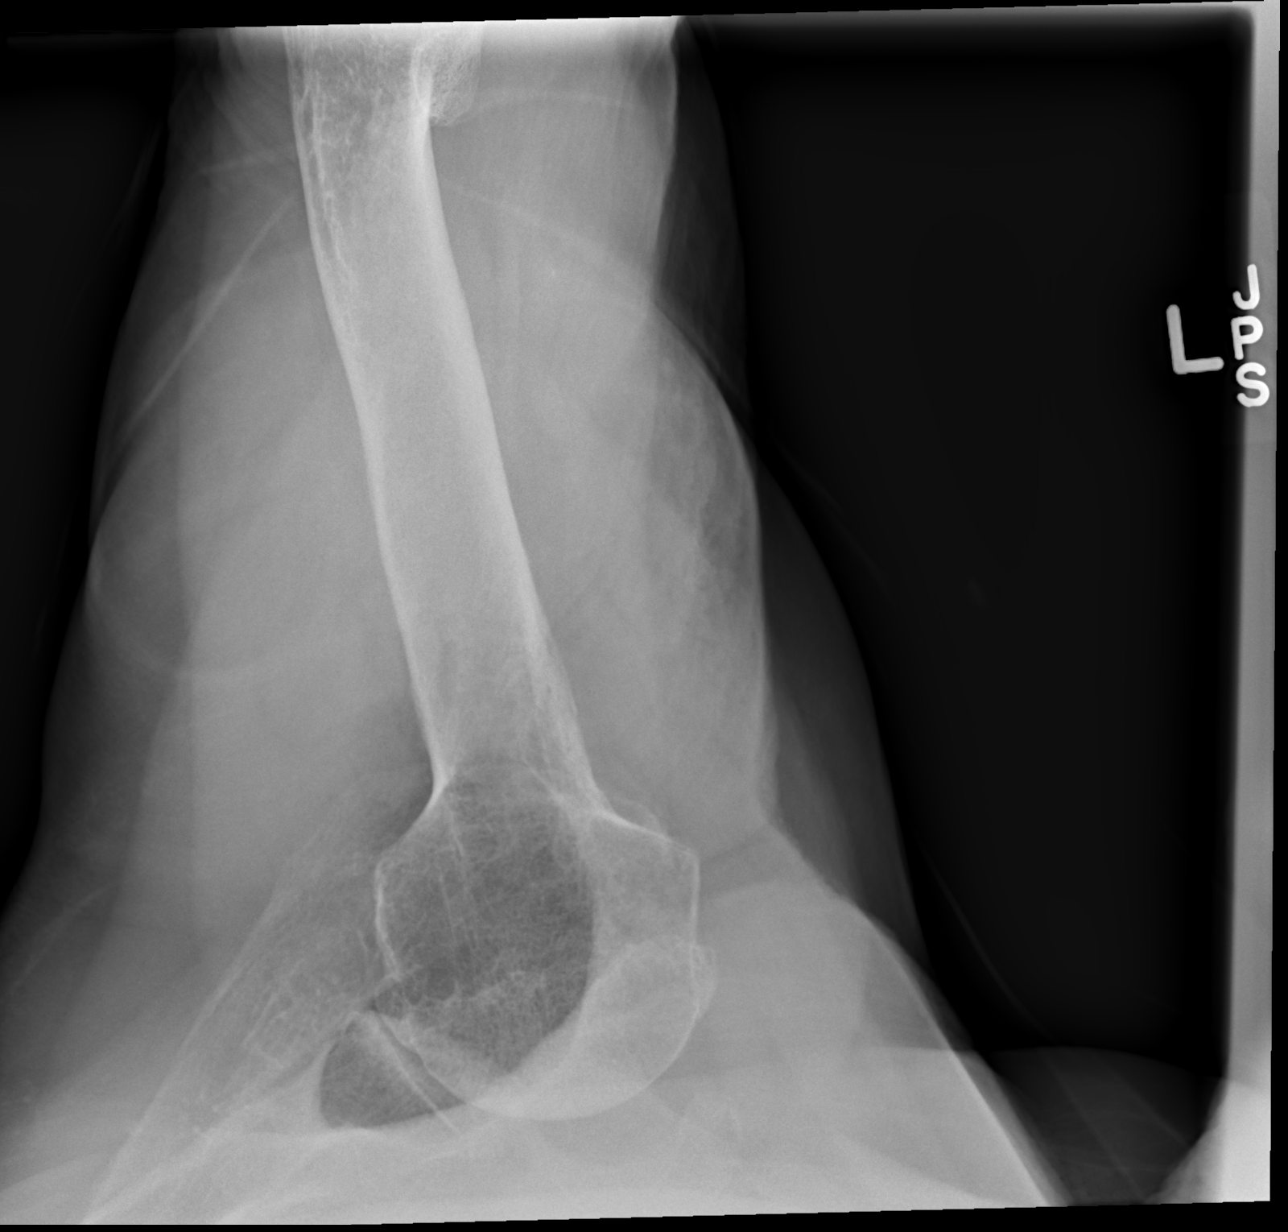

[3 of 3 positions shown; findings below may reference images not displayed]

FINDINGS: Degenerative changes of the acromioclavicular joint are seen. Some
degenerative remodeling of the humeral head is noted as well. No
acute fracture or dislocation is seen. The underlying bony thorax is
unremarkable.
IMPRESSION: Degenerative change without acute abnormality.

## 2014-12-05 IMAGING — CR DG SHOULDER 2+V*R*
3 series · 3 of 3 positions shown · non-contrast
Comparison: Left shoulder series of today's date.

CLINICAL DATA: Chronic bilateral shoulder pain with worsening of
symptoms over the past few days

EXAM:
RIGHT SHOULDER - 2+ VIEW

[w shoulder y-view right]
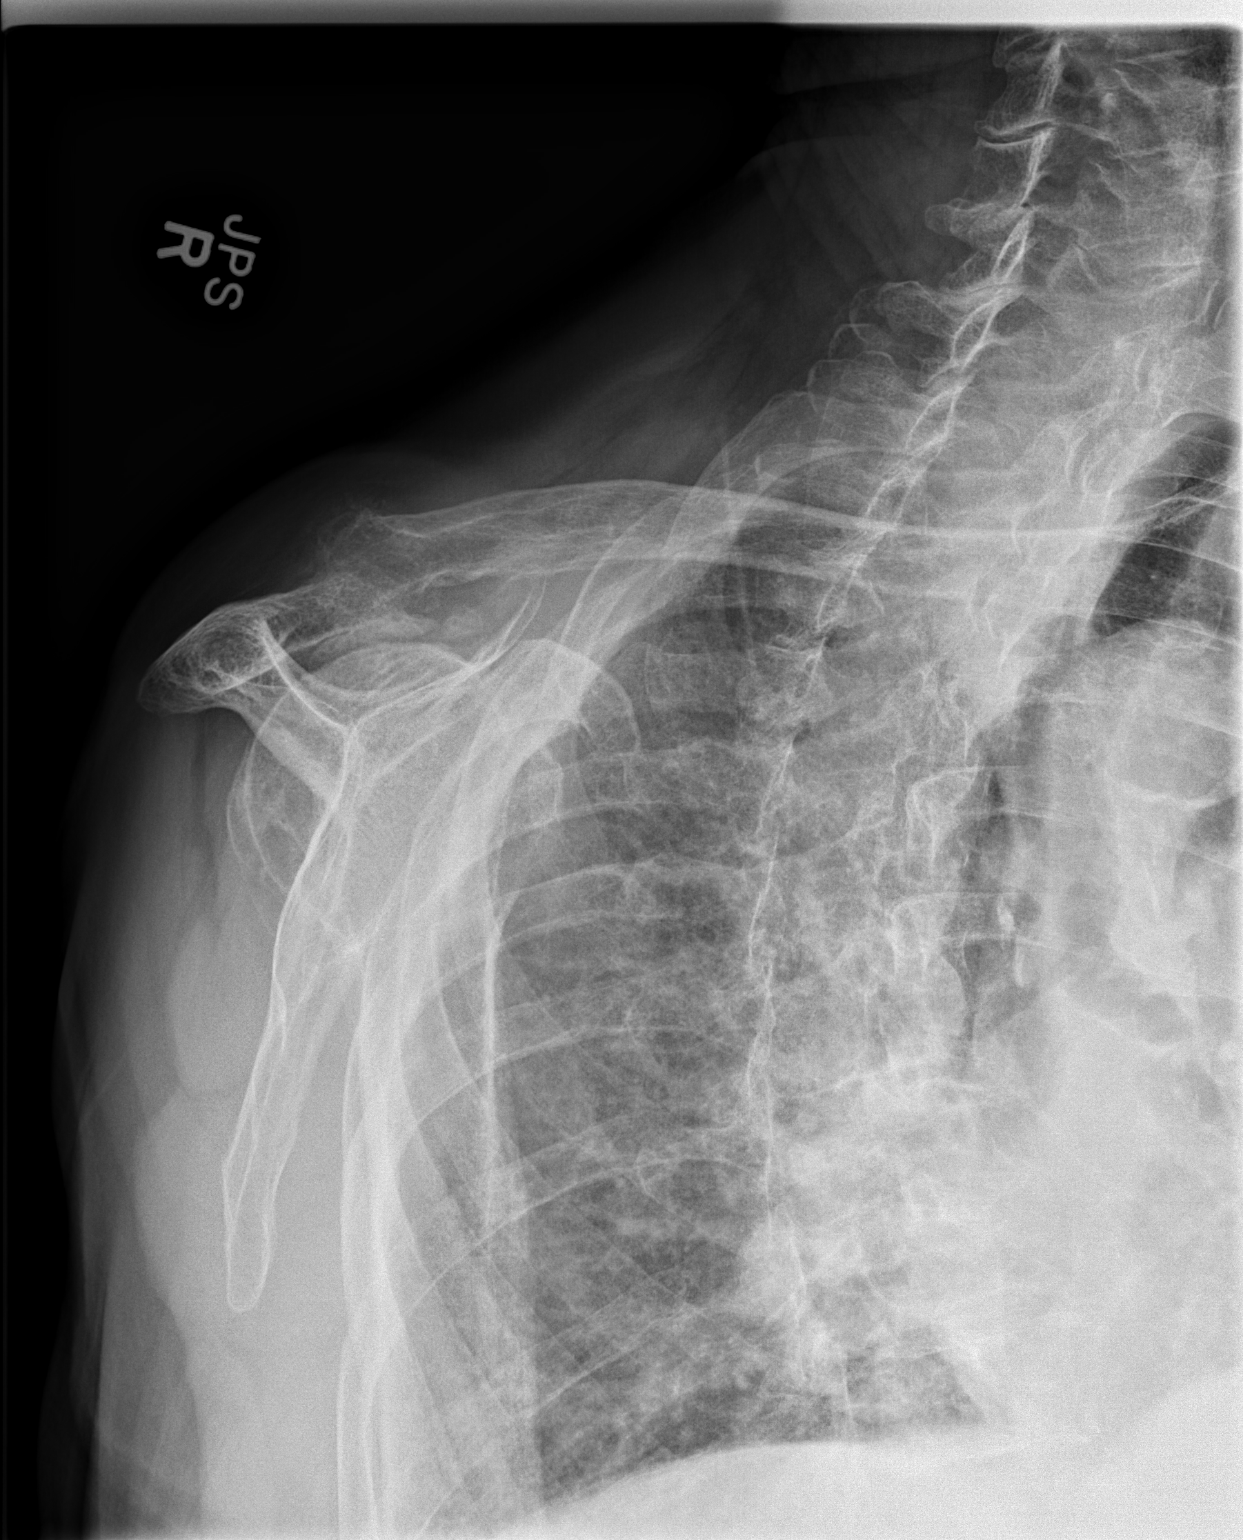

[w shoulder external right]
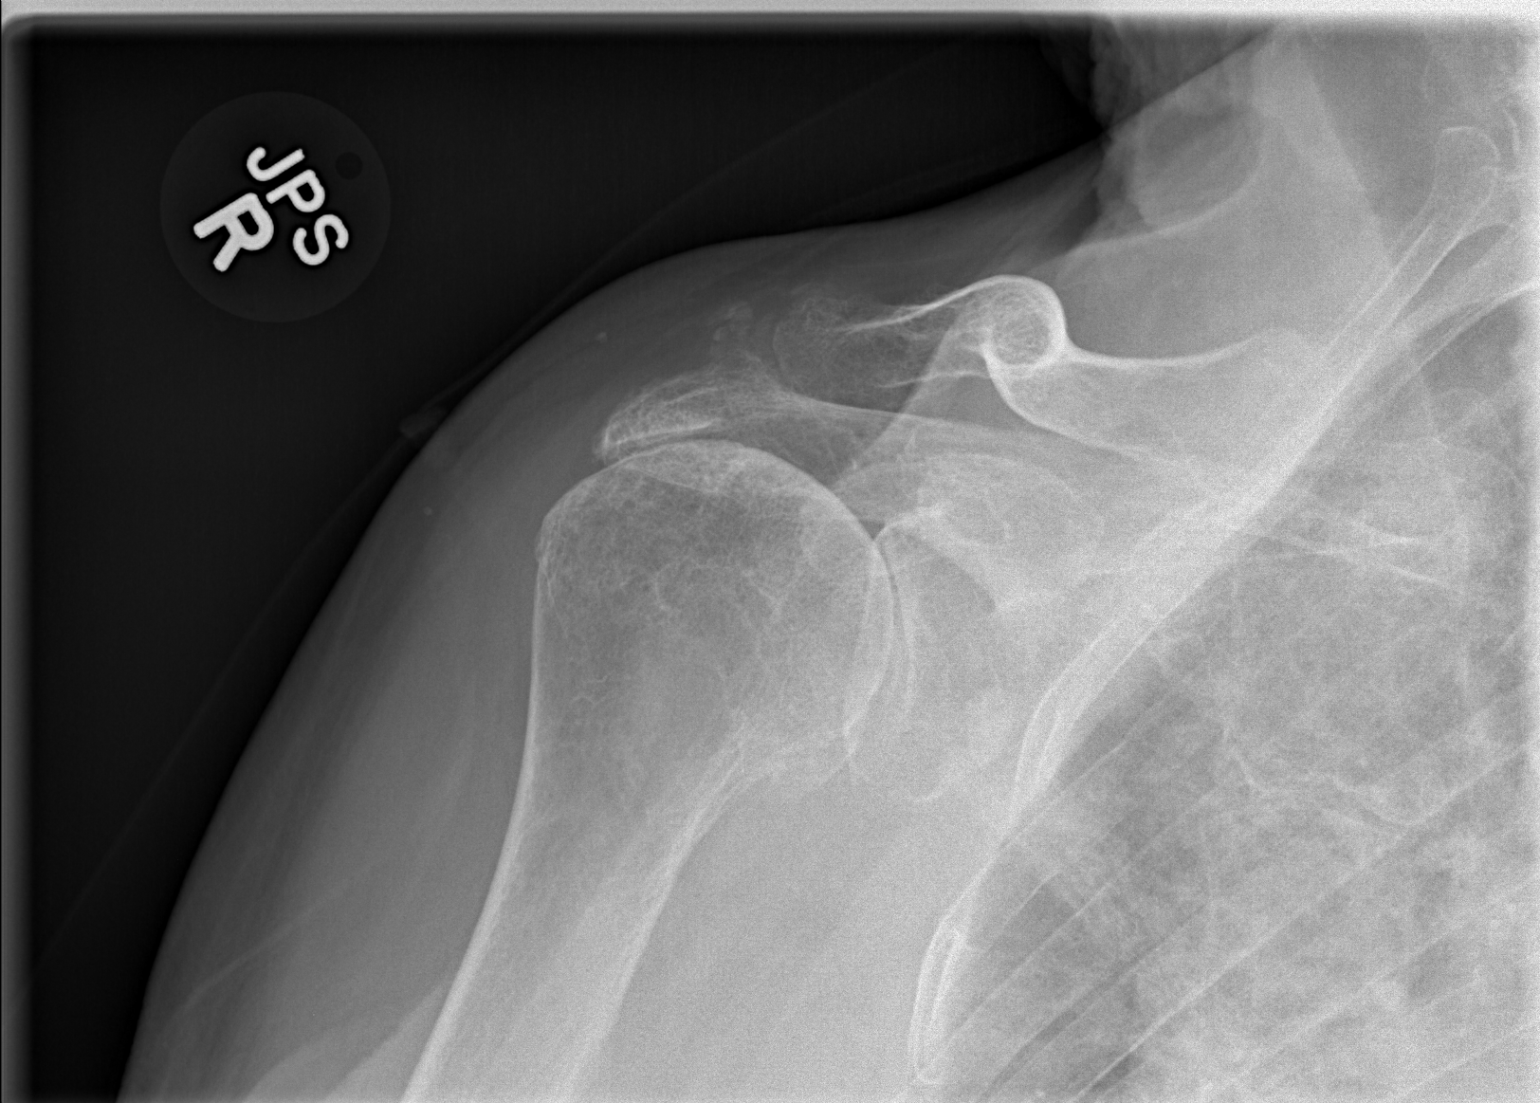

[x shoulder ap right]
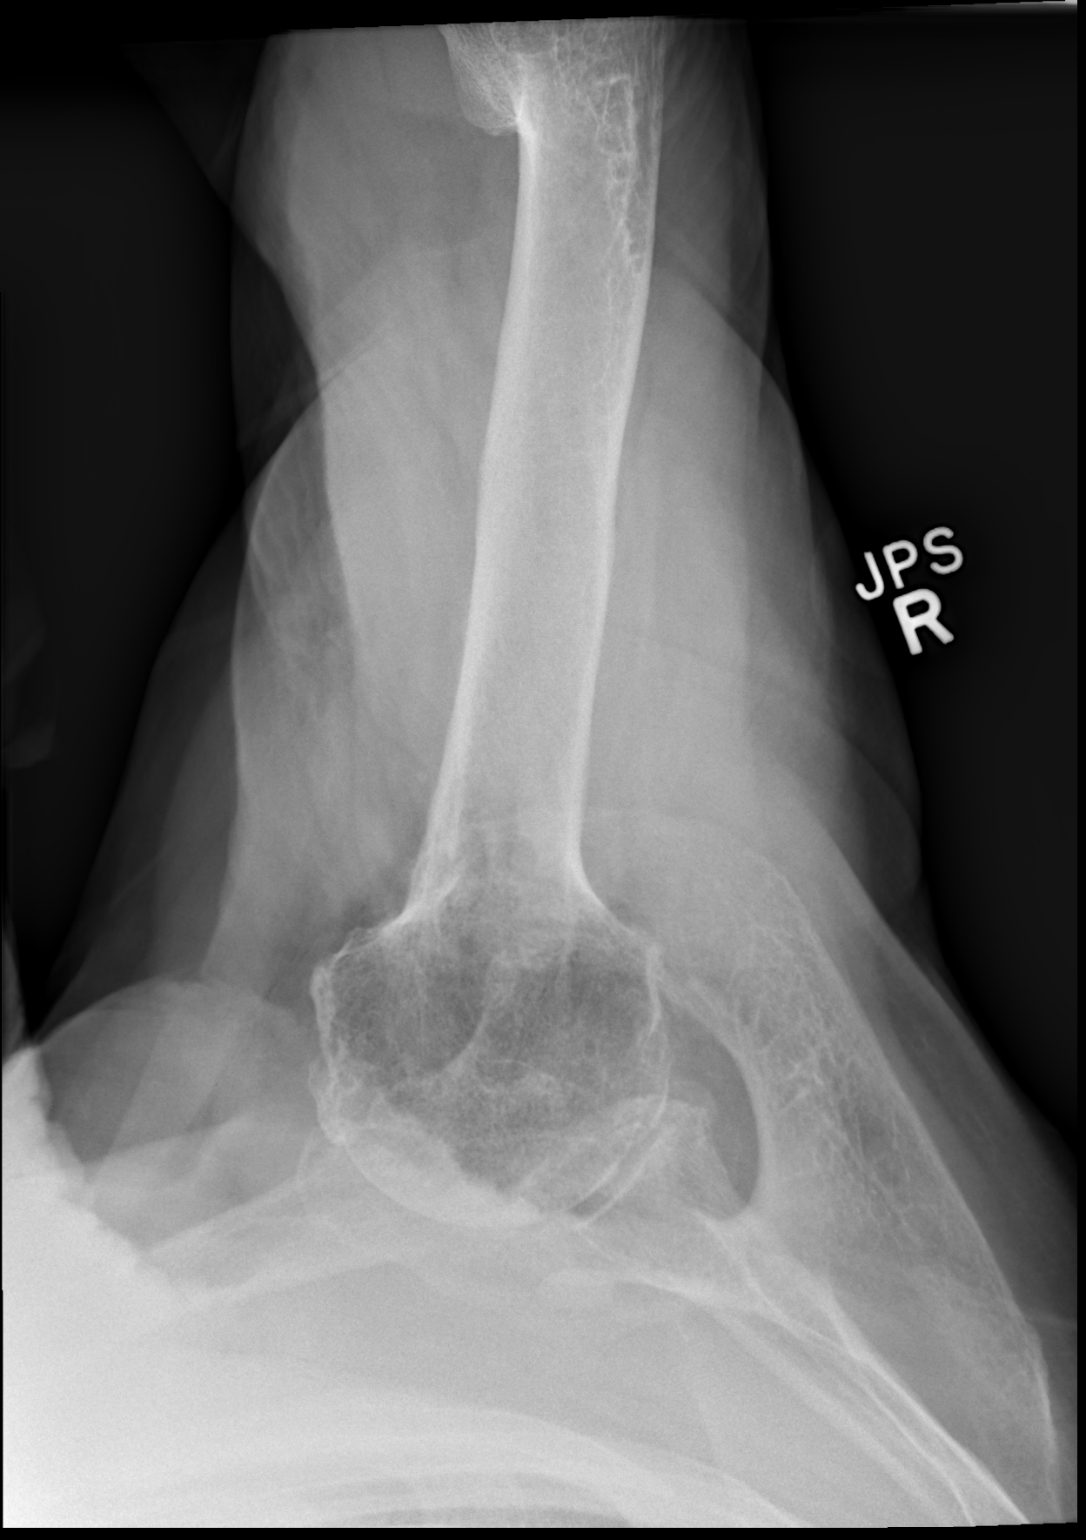

[3 of 3 positions shown; findings below may reference images not displayed]

FINDINGS: The bones of the right shoulder are osteopenic. There is narrowing
of the glenohumeral joint space and of the subacromial subdeltoid
space. There are degenerative changes of the AC joint with
osteophyte formation. There is no acute fracture nor dislocation.
The observed portions of the right clavicle and upper right ribs are
intact.
IMPRESSION: There is no acute bony abnormality of the right shoulder. There are
chronic degenerative changes.

## 2015-06-18 ENCOUNTER — Encounter: Payer: Self-pay | Admitting: Internal Medicine

## 2017-12-25 DEATH — deceased
